# Patient Record
Sex: Male | Born: 1985 | Hispanic: Yes | Marital: Single | Smoking: Former smoker
Health system: Southern US, Community
[De-identification: ages and names within clinical notes are randomized; demographics above are authoritative.]

## PROBLEM LIST (undated history)

## (undated) DIAGNOSIS — G479 Sleep disorder, unspecified: Secondary | ICD-10-CM

## (undated) DIAGNOSIS — E669 Obesity, unspecified: Secondary | ICD-10-CM

## (undated) DIAGNOSIS — K219 Gastro-esophageal reflux disease without esophagitis: Secondary | ICD-10-CM

## (undated) DIAGNOSIS — T8859XA Other complications of anesthesia, initial encounter: Secondary | ICD-10-CM

## (undated) DIAGNOSIS — M5431 Sciatica, right side: Secondary | ICD-10-CM

## (undated) DIAGNOSIS — M549 Dorsalgia, unspecified: Secondary | ICD-10-CM

## (undated) DIAGNOSIS — J45909 Unspecified asthma, uncomplicated: Secondary | ICD-10-CM

## (undated) DIAGNOSIS — T4145XA Adverse effect of unspecified anesthetic, initial encounter: Secondary | ICD-10-CM

## (undated) DIAGNOSIS — L03314 Cellulitis of groin: Secondary | ICD-10-CM

## (undated) HISTORY — PX: TONSILLECTOMY: SUR1361

---

## 2006-06-09 ENCOUNTER — Emergency Department (HOSPITAL_COMMUNITY): Admission: EM | Admit: 2006-06-09 | Discharge: 2006-06-09 | Payer: Self-pay | Admitting: Emergency Medicine

## 2007-01-12 ENCOUNTER — Emergency Department (HOSPITAL_COMMUNITY): Admission: EM | Admit: 2007-01-12 | Discharge: 2007-01-12 | Payer: Self-pay | Admitting: Emergency Medicine

## 2008-04-13 ENCOUNTER — Emergency Department (HOSPITAL_COMMUNITY): Admission: EM | Admit: 2008-04-13 | Discharge: 2008-04-13 | Payer: Self-pay | Admitting: Emergency Medicine

## 2008-09-29 ENCOUNTER — Emergency Department (HOSPITAL_COMMUNITY): Admission: EM | Admit: 2008-09-29 | Discharge: 2008-09-29 | Payer: Self-pay | Admitting: Emergency Medicine

## 2008-11-22 ENCOUNTER — Emergency Department (HOSPITAL_COMMUNITY): Admission: EM | Admit: 2008-11-22 | Discharge: 2008-11-22 | Payer: Self-pay | Admitting: Emergency Medicine

## 2010-03-08 ENCOUNTER — Emergency Department (HOSPITAL_COMMUNITY): Admission: EM | Admit: 2010-03-08 | Discharge: 2010-03-08 | Payer: Self-pay | Admitting: Emergency Medicine

## 2011-05-10 ENCOUNTER — Emergency Department (HOSPITAL_COMMUNITY)
Admission: EM | Admit: 2011-05-10 | Discharge: 2011-05-10 | Disposition: A | Discharge status: Home / Self Care | Payer: Self-pay | Attending: Emergency Medicine | Admitting: Emergency Medicine

## 2011-05-10 ENCOUNTER — Encounter: Payer: Self-pay | Admitting: Emergency Medicine

## 2011-05-10 DIAGNOSIS — M5430 Sciatica, unspecified side: Secondary | ICD-10-CM | POA: Insufficient documentation

## 2011-05-10 DIAGNOSIS — M545 Low back pain, unspecified: Secondary | ICD-10-CM | POA: Insufficient documentation

## 2011-05-10 DIAGNOSIS — M5431 Sciatica, right side: Secondary | ICD-10-CM

## 2011-05-10 DIAGNOSIS — IMO0001 Reserved for inherently not codable concepts without codable children: Secondary | ICD-10-CM | POA: Insufficient documentation

## 2011-05-10 DIAGNOSIS — M79609 Pain in unspecified limb: Secondary | ICD-10-CM | POA: Insufficient documentation

## 2011-05-10 DIAGNOSIS — M25559 Pain in unspecified hip: Secondary | ICD-10-CM | POA: Insufficient documentation

## 2011-05-10 MED ORDER — DIAZEPAM 5 MG PO TABS
5.0000 mg | ORAL_TABLET | Freq: Once | ORAL | Status: AC
  Administered 2011-05-10: 5 mg via ORAL
  Filled 2011-05-10: qty 1

## 2011-05-10 MED ORDER — PREDNISONE 10 MG PO TABS: 50.0000 mg | ORAL_TABLET | Freq: Every day | ORAL | Status: AC

## 2011-05-10 MED ORDER — IBUPROFEN 800 MG PO TABS: 800.0000 mg | ORAL_TABLET | Freq: Three times a day (TID) | ORAL | Status: AC

## 2011-05-10 MED ORDER — DIAZEPAM 5 MG PO TABS: 5.0000 mg | ORAL_TABLET | Freq: Two times a day (BID) | ORAL | Status: AC

## 2011-05-10 NOTE — ED Provider Notes (Signed)
History     CSN: [id number] Arrival date & time: 05/10/2011  8:09 AM   First MD Initiated Contact with Patient 05/10/11 [phone]      Chief Complaint  Patient presents with  . Generalized Body Aches    (Consider location/radiation/quality/duration/timing/severity/associated sxs/prior treatment) HPI Comments: Patient states he has had 2-3 months of right lower extremity pain.  States pain begins in his right buttock and radiates down the back of his thigh into the top of his right calf.  Pain is exacerbated by bending over in certain positions.  Patient also has occasional low back pain.  Denies any known injury, fever, loss of control of bowel or bladder,abdominal pain, dysuria, or urinary frequency or urgency, change in bowel habits.  Has been using BenGay without relief.  Patient has no known medical problems, and does not have a primary care Dr.  The history is provided by the patient.    History reviewed. No pertinent past medical history.  History reviewed. No pertinent past surgical history.  No family history on file.  History  Substance Use Topics  . Smoking status: Never Smoker   . Smokeless tobacco: Not on file  . Alcohol Use: Yes      Review of Systems  All other systems reviewed and are negative.    Allergies  Review of patient's allergies indicates no known allergies.  Home Medications  No current outpatient prescriptions on file.  BP 130/70  Pulse 65  Temp(Src) 98.8 F (37.1 C) (Oral)  Resp 20  SpO2 96%  Physical Exam  Constitutional: He is oriented to person, place, and time. He appears well-developed and well-nourished.  HENT:  Head: Normocephalic and atraumatic.  Neck: Neck supple.  Cardiovascular: Normal rate, regular rhythm and normal heart sounds.   Pulmonary/Chest: Breath sounds normal. No respiratory distress. He has no wheezes. He has no rales. He exhibits no tenderness.  Abdominal: Soft. Bowel sounds are normal. There is no tenderness.    Musculoskeletal:       Spine nontender.  Straight leg raise on right is positive, left is negative.  Distal pulses intact and equal bilaterally.  No peripheral edema.  Sensation is intact.  Strength is five out of five in bilateral lower extremities. Skin is without erythema, edema or warmth.    Neurological: He is alert and oriented to person, place, and time.    ED Course  Procedures (including critical care time)  Labs Reviewed - No data to display No results found.   1. Sciatica of right side       MDM  Patient with no known injury, chronic (2-3 month) right buttock, hip, thigh pain. Has only attempted BenGay for pain.Spine is nontender.  Full strength, sensation intact.  No fever, loss of control of bowel or bladder.         Todd Velez, [place] 05/10/11 1455

## 2011-05-10 NOTE — ED Provider Notes (Signed)
Medical screening examination/treatment/procedure(s) were performed by non-physician practitioner and as supervising physician I was immediately available for consultation/collaboration.   Emilina Smarr M Emmarose Klinke, DO 05/10/11 1922 

## 2011-05-10 NOTE — ED Notes (Signed)
Rt sided leg pain x several months that radiates up to hip and lower back no injury he states

## 2011-09-21 ENCOUNTER — Other Ambulatory Visit (HOSPITAL_COMMUNITY): Payer: Self-pay | Admitting: Chiropractic Medicine

## 2011-09-21 DIAGNOSIS — M545 Low back pain: Secondary | ICD-10-CM

## 2011-09-24 ENCOUNTER — Inpatient Hospital Stay (HOSPITAL_COMMUNITY): Admission: RE | Admit: 2011-09-24 | Payer: Self-pay | Source: Ambulatory Visit

## 2011-10-07 ENCOUNTER — Other Ambulatory Visit (HOSPITAL_COMMUNITY): Payer: Self-pay | Admitting: Chiropractic Medicine

## 2011-10-07 DIAGNOSIS — M545 Low back pain: Secondary | ICD-10-CM

## 2011-10-10 ENCOUNTER — Ambulatory Visit (HOSPITAL_COMMUNITY)
Admission: RE | Admit: 2011-10-10 | Discharge: 2011-10-10 | Disposition: A | Discharge status: Home / Self Care | Payer: Self-pay | Source: Ambulatory Visit | Attending: Chiropractic Medicine | Admitting: Chiropractic Medicine

## 2011-10-10 DIAGNOSIS — M5146 Schmorl's nodes, lumbar region: Secondary | ICD-10-CM | POA: Insufficient documentation

## 2011-10-10 DIAGNOSIS — M5126 Other intervertebral disc displacement, lumbar region: Secondary | ICD-10-CM | POA: Insufficient documentation

## 2011-10-10 DIAGNOSIS — M79609 Pain in unspecified limb: Secondary | ICD-10-CM | POA: Insufficient documentation

## 2011-10-10 DIAGNOSIS — Q7649 Other congenital malformations of spine, not associated with scoliosis: Secondary | ICD-10-CM | POA: Insufficient documentation

## 2011-10-10 DIAGNOSIS — M545 Low back pain, unspecified: Secondary | ICD-10-CM | POA: Insufficient documentation

## 2011-11-16 ENCOUNTER — Other Ambulatory Visit: Payer: Self-pay | Admitting: Orthopedic Surgery

## 2011-11-18 ENCOUNTER — Encounter (HOSPITAL_COMMUNITY): Payer: Self-pay

## 2011-12-21 ENCOUNTER — Emergency Department (INDEPENDENT_AMBULATORY_CARE_PROVIDER_SITE_OTHER)
Admission: EM | Admit: 2011-12-21 | Discharge: 2011-12-21 | Disposition: A | Discharge status: Home / Self Care | Payer: Self-pay | Source: Home / Self Care | Attending: Emergency Medicine | Admitting: Emergency Medicine

## 2011-12-21 ENCOUNTER — Encounter (HOSPITAL_COMMUNITY): Payer: Self-pay | Admitting: *Deleted

## 2011-12-21 DIAGNOSIS — R221 Localized swelling, mass and lump, neck: Secondary | ICD-10-CM

## 2011-12-21 DIAGNOSIS — R220 Localized swelling, mass and lump, head: Secondary | ICD-10-CM

## 2011-12-21 HISTORY — DX: Dorsalgia, unspecified: M54.9

## 2011-12-21 MED ORDER — DOCOSANOL 10 % EX CREA: TOPICAL_CREAM | CUTANEOUS | Status: DC

## 2011-12-21 NOTE — ED Notes (Signed)
Pt  Reports  Some  Swelling of  His  Lips   For  About  5  Days    Small  Bumps      No  Angioedema  Appears  In no  Distress   Speaking in  Complete  sentances  No  New  meds  Or  Any  Known  Causative  Agents

## 2011-12-21 NOTE — ED Provider Notes (Signed)
History     CSN: [id number]  Arrival date & time 12/21/11  1411   First MD Initiated Contact with Patient 12/21/11 1507      Chief Complaint  Patient presents with  . Facial Swelling    (Consider location/radiation/quality/duration/timing/severity/associated sxs/prior treatment) HPI Comments: Patient presents this afternoon complaining of some swelling to his upper lip for about 5 days in which he noted small little bumps on his upper lip. Patient denies any facial swelling, difficulty swallowing, blister formations burning tingling or fevers.  The history is provided by the patient.    Past Medical History  Diagnosis Date  . Back pain     History reviewed. No pertinent past surgical history.  No family history on file.  History  Substance Use Topics  . Smoking status: Never Smoker   . Smokeless tobacco: Not on file  . Alcohol Use: Yes      Review of Systems  Constitutional: Negative for fever, activity change, appetite change and fatigue.  Skin: Negative for pallor, rash and wound.  Neurological: Negative for dizziness.    Allergies  Review of patient's allergies indicates no known allergies.  Home Medications   Current Outpatient Rx  Name Route Sig Dispense Refill  . DOCOSANOL 10 % EX CREA  Apply up to 5 times per day 2 g 0  . PREDNISONE (PAK) 10 MG PO TABS Oral Take 10 mg by mouth daily.      BP 131/67  Pulse 106  Temp 98.2 F (36.8 C) (Oral)  Resp 20  SpO2 98%  Physical Exam  Nursing note and vitals reviewed. Constitutional: Vital signs are normal. He appears well-developed and well-nourished.  Non-toxic appearance. He does not have a sickly appearance. He does not appear ill. No distress.  HENT:  Mouth/Throat: Oropharynx is clear and moist and mucous membranes are normal.    Abdominal: Soft.  Skin: Rash noted. There is erythema.    ED Course  Procedures (including critical care time)   Labs Reviewed  HERPES SIMPLEX VIRUS CULTURE   No  results found.   1. Swollen upper lip       MDM  And minimally visible upper lip eruption. Explained to patient that we couldn't rule out that this is not an eruption caused by herpes simplex type I, sample obtained for viral culture. Patient was prescribed with topical Abreva.        Jimmie Molly, MD 12/21/11 2034

## 2011-12-23 LAB — HERPES SIMPLEX VIRUS CULTURE: Culture: NOT DETECTED

## 2011-12-27 ENCOUNTER — Telehealth (HOSPITAL_COMMUNITY): Payer: Self-pay | Admitting: *Deleted

## 2011-12-27 NOTE — ED Notes (Signed)
Pt. called on VM and asked for his lab results. I called and left a message. Pt. called back.  Pt. verified x 2 and given results.  Herpes culture lip: No Herpes Simplex Virus detected.  Pt. looked on line and thinks they are [place] spots and thinks he needs Tetrinol cream to treat it. Denies pain or itching with them. States they are small and white. Discussed with Dr. Tressia Danas and she said to refer pt. to the dermatologist.  Pt. told to do this.  I said we do not have one on call. I told him [facility] is big but they have a 3 month wait for appointments. I told him he might do better to try other offices to see if he can get in sooner. Vassie Moselle 12/27/2011

## 2011-12-27 NOTE — ED Notes (Signed)
Pt. called back and asked if we tested him for GC/chlamydia. I said no just the Herpes. He asked if this was anything to worry about. I told him since Herpes test was neg. It probably was not serious but to f/u with dermatologist to find out what it is. Vassie Moselle 12/27/2011

## 2012-01-05 ENCOUNTER — Encounter (HOSPITAL_COMMUNITY): Payer: Self-pay

## 2012-01-05 ENCOUNTER — Ambulatory Visit (HOSPITAL_COMMUNITY)
Admission: RE | Admit: 2012-01-05 | Discharge: 2012-01-05 | Disposition: A | Discharge status: Home / Self Care | Payer: Self-pay | Source: Ambulatory Visit | Attending: Orthopedic Surgery | Admitting: Orthopedic Surgery

## 2012-01-05 ENCOUNTER — Encounter (HOSPITAL_COMMUNITY)
Admission: RE | Admit: 2012-01-05 | Discharge: 2012-01-05 | Disposition: A | Discharge status: Home / Self Care | Payer: Self-pay | Source: Ambulatory Visit | Attending: Orthopedic Surgery | Admitting: Orthopedic Surgery

## 2012-01-05 DIAGNOSIS — J45909 Unspecified asthma, uncomplicated: Secondary | ICD-10-CM | POA: Insufficient documentation

## 2012-01-05 DIAGNOSIS — Z01818 Encounter for other preprocedural examination: Secondary | ICD-10-CM | POA: Insufficient documentation

## 2012-01-05 DIAGNOSIS — K219 Gastro-esophageal reflux disease without esophagitis: Secondary | ICD-10-CM | POA: Insufficient documentation

## 2012-01-05 DIAGNOSIS — G479 Sleep disorder, unspecified: Secondary | ICD-10-CM | POA: Insufficient documentation

## 2012-01-05 DIAGNOSIS — F17200 Nicotine dependence, unspecified, uncomplicated: Secondary | ICD-10-CM | POA: Insufficient documentation

## 2012-01-05 HISTORY — DX: Gastro-esophageal reflux disease without esophagitis: K21.9

## 2012-01-05 HISTORY — DX: Obesity, unspecified: E66.9

## 2012-01-05 HISTORY — DX: Unspecified asthma, uncomplicated: J45.909

## 2012-01-05 HISTORY — DX: Sleep disorder, unspecified: G47.9

## 2012-01-05 LAB — CBC
HCT: 44.8 % (ref 39.0–52.0)
Hemoglobin: 15.5 g/dL (ref 13.0–17.0)
RBC: 5.31 MIL/uL (ref 4.22–5.81)
WBC: 9.2 10*3/uL (ref 4.0–10.5)

## 2012-01-05 LAB — TYPE AND SCREEN

## 2012-01-05 LAB — URINALYSIS, ROUTINE W REFLEX MICROSCOPIC
Bilirubin Urine: NEGATIVE
Glucose, UA: NEGATIVE mg/dL
Leukocytes, UA: NEGATIVE
Nitrite: NEGATIVE
Specific Gravity, Urine: 1.022 (ref 1.005–1.030)
pH: 7 (ref 5.0–8.0)

## 2012-01-05 LAB — COMPREHENSIVE METABOLIC PANEL
ALT: 34 U/L (ref 0–53)
Alkaline Phosphatase: 67 U/L (ref 39–117)
BUN: 11 mg/dL (ref 6–23)
CO2: 26 mEq/L (ref 19–32)
Chloride: 101 mEq/L (ref 96–112)
GFR calc Af Amer: >90 mL/min (ref >90)
Glucose, Bld: 90 mg/dL (ref 70–99)
Potassium: 4 mEq/L (ref 3.5–5.1)
Sodium: 137 mEq/L (ref 135–145)
Total Bilirubin: 0.2 mg/dL — ABNORMAL LOW (ref 0.3–1.2)
Total Protein: 7.6 g/dL (ref 6.0–8.3)

## 2012-01-05 LAB — DIFFERENTIAL
Basophils Relative: 0 % (ref 0–1)
Eosinophils Absolute: 0.2 10*3/uL (ref 0.0–0.7)
Eosinophils Relative: 2 % (ref 0–5)
Lymphs Abs: 3 10*3/uL (ref 0.7–4.0)
Neutrophils Relative %: 54 % (ref 43–77)

## 2012-01-05 LAB — SURGICAL PCR SCREEN: Staphylococcus aureus: NEGATIVE

## 2012-01-05 LAB — APTT: aPTT: 32 seconds (ref 24–37)

## 2012-01-05 LAB — PROTIME-INR
INR: 0.98 (ref 0.00–1.49)
Prothrombin Time: 13.2 seconds (ref 11.6–15.2)

## 2012-01-05 LAB — ABO/RH: ABO/RH(D): O NEG

## 2012-01-05 NOTE — Pre-Procedure Instructions (Signed)
20 Todd Velez  01/05/2012   Your procedure is scheduled on:  01/12/2012  Report to [facility] at 5:30 AM.  Call this number if you have problems the morning of surgery: [phone]   Remember:   Do not eat food or drink liquid :After Midnight.  Wednesday      Take these medicines the morning of surgery with A SIP OF WATER: NOTHING   Do not wear jewelry, make-up or nail polish.  Do not wear lotions, powders, or perfumes. You may wear deodorant.  Do not shave 48 hours prior to surgery. Men may shave face and neck.  Do not bring valuables to the hospital.  Contacts, dentures or bridgework may not be worn into surgery.  Leave suitcase in the car. After surgery it may be brought to your room.  For patients admitted to the hospital, checkout time is 11:00 AM the day of discharge.   Patients discharged the day of surgery will not be allowed to drive home.  Name and phone number of your driver: /w friend   Special Instructions: CHG Shower Use Special Wash: 1/2 bottle night before surgery and 1/2 bottle morning of surgery.   Please read over the following fact sheets that you were given: Pain Booklet, Coughing and Deep Breathing, Blood Transfusion Information, MRSA Information and Surgical Site Infection Prevention

## 2012-01-11 MED ORDER — DEXTROSE 5 % IV SOLN
3.0000 g | INTRAVENOUS | Status: AC
  Administered 2012-01-12: 3 g via INTRAVENOUS
  Filled 2012-01-11: qty 3000

## 2012-01-11 MED ORDER — POVIDONE-IODINE 7.5 % EX SOLN
Freq: Once | CUTANEOUS | Status: DC
  Filled 2012-01-11: qty 118

## 2012-01-12 ENCOUNTER — Ambulatory Visit (HOSPITAL_COMMUNITY): Payer: Self-pay | Admitting: Anesthesiology

## 2012-01-12 ENCOUNTER — Encounter (HOSPITAL_COMMUNITY)
Admission: RE | Disposition: A | Discharge status: Home / Self Care | Payer: Self-pay | Source: Ambulatory Visit | Attending: Orthopedic Surgery

## 2012-01-12 ENCOUNTER — Ambulatory Visit (HOSPITAL_COMMUNITY): Payer: Self-pay

## 2012-01-12 ENCOUNTER — Encounter (HOSPITAL_COMMUNITY): Payer: Self-pay | Admitting: *Deleted

## 2012-01-12 ENCOUNTER — Encounter (HOSPITAL_COMMUNITY): Payer: Self-pay | Admitting: Anesthesiology

## 2012-01-12 ENCOUNTER — Ambulatory Visit (HOSPITAL_COMMUNITY)
Admission: RE | Admit: 2012-01-12 | Discharge: 2012-01-12 | Disposition: A | Discharge status: Home / Self Care | Payer: MEDICAID | Source: Ambulatory Visit | Attending: Orthopedic Surgery | Admitting: Orthopedic Surgery

## 2012-01-12 DIAGNOSIS — M519 Unspecified thoracic, thoracolumbar and lumbosacral intervertebral disc disorder: Secondary | ICD-10-CM | POA: Insufficient documentation

## 2012-01-12 DIAGNOSIS — M5410 Radiculopathy, site unspecified: Secondary | ICD-10-CM

## 2012-01-12 DIAGNOSIS — M5380 Other specified dorsopathies, site unspecified: Secondary | ICD-10-CM | POA: Insufficient documentation

## 2012-01-12 DIAGNOSIS — G4730 Sleep apnea, unspecified: Secondary | ICD-10-CM | POA: Insufficient documentation

## 2012-01-12 DIAGNOSIS — K219 Gastro-esophageal reflux disease without esophagitis: Secondary | ICD-10-CM | POA: Insufficient documentation

## 2012-01-12 HISTORY — PX: LUMBAR LAMINECTOMY/DECOMPRESSION MICRODISCECTOMY: SHX5026

## 2012-01-12 SURGERY — LUMBAR LAMINECTOMY/DECOMPRESSION MICRODISCECTOMY
Anesthesia: General | Site: Spine Lumbar | Laterality: Right | Wound class: Clean

## 2012-01-12 MED ORDER — HYDROMORPHONE HCL PF 1 MG/ML IJ SOLN
INTRAMUSCULAR | Status: AC
  Filled 2012-01-12: qty 2

## 2012-01-12 MED ORDER — HYDROMORPHONE HCL PF 1 MG/ML IJ SOLN
0.2500 mg | INTRAMUSCULAR | Status: DC | PRN
  Administered 2012-01-12 (×5): 0.5 mg via INTRAVENOUS

## 2012-01-12 MED ORDER — ONDANSETRON HCL 4 MG/2ML IJ SOLN
INTRAMUSCULAR | Status: DC | PRN
  Administered 2012-01-12: 4 mg via INTRAVENOUS

## 2012-01-12 MED ORDER — THROMBIN 20000 UNITS EX KIT
PACK | CUTANEOUS | Status: DC | PRN
  Administered 2012-01-12: 13:00:00 via TOPICAL

## 2012-01-12 MED ORDER — BACITRACIN ZINC 500 UNIT/GM EX OINT
TOPICAL_OINTMENT | CUTANEOUS | Status: AC
  Filled 2012-01-12: qty 15

## 2012-01-12 MED ORDER — MORPHINE SULFATE 10 MG/ML IJ SOLN
INTRAMUSCULAR | Status: DC | PRN
  Administered 2012-01-12 (×2): 4 mg via INTRAVENOUS

## 2012-01-12 MED ORDER — METHYLPREDNISOLONE ACETATE 80 MG/ML IJ SUSP
INTRAMUSCULAR | Status: DC | PRN
  Administered 2012-01-12: .5 mL via INTRA_ARTICULAR

## 2012-01-12 MED ORDER — 0.9 % SODIUM CHLORIDE (POUR BTL) OPTIME
TOPICAL | Status: DC | PRN
  Administered 2012-01-12: 1000 mL

## 2012-01-12 MED ORDER — BACITRACIN ZINC 500 UNIT/GM EX OINT
TOPICAL_OINTMENT | CUTANEOUS | Status: DC | PRN
  Administered 2012-01-12: 1 via TOPICAL

## 2012-01-12 MED ORDER — PHENYLEPHRINE HCL 10 MG/ML IJ SOLN
INTRAMUSCULAR | Status: DC | PRN
  Administered 2012-01-12: 40 ug via INTRAVENOUS

## 2012-01-12 MED ORDER — INDIGOTINDISULFONATE SODIUM 8 MG/ML IJ SOLN
INTRAMUSCULAR | Status: AC
  Filled 2012-01-12: qty 5

## 2012-01-12 MED ORDER — GLYCOPYRROLATE 0.2 MG/ML IJ SOLN
INTRAMUSCULAR | Status: DC | PRN
  Administered 2012-01-12: 0.6 mg via INTRAVENOUS

## 2012-01-12 MED ORDER — LIDOCAINE HCL (CARDIAC) 20 MG/ML IV SOLN
INTRAVENOUS | Status: DC | PRN
  Administered 2012-01-12: 100 mg via INTRAVENOUS

## 2012-01-12 MED ORDER — LACTATED RINGERS IV SOLN
INTRAVENOUS | Status: DC
  Administered 2012-01-12 (×4): via INTRAVENOUS

## 2012-01-12 MED ORDER — ROCURONIUM BROMIDE 100 MG/10ML IV SOLN
INTRAVENOUS | Status: DC | PRN
  Administered 2012-01-12: 40 mg via INTRAVENOUS

## 2012-01-12 MED ORDER — PROPOFOL 10 MG/ML IV BOLUS
INTRAVENOUS | Status: DC | PRN
  Administered 2012-01-12: 300 mg via INTRAVENOUS

## 2012-01-12 MED ORDER — METHYLPREDNISOLONE ACETATE 80 MG/ML IJ SUSP
INTRAMUSCULAR | Status: AC
  Filled 2012-01-12: qty 1

## 2012-01-12 MED ORDER — BUPIVACAINE-EPINEPHRINE 0.25% -1:200000 IJ SOLN
INTRAMUSCULAR | Status: DC | PRN
  Administered 2012-01-12: 5 mL

## 2012-01-12 MED ORDER — BUPIVACAINE-EPINEPHRINE PF 0.25-1:200000 % IJ SOLN
INTRAMUSCULAR | Status: AC
  Filled 2012-01-12: qty 30

## 2012-01-12 MED ORDER — SUFENTANIL CITRATE 50 MCG/ML IV SOLN
INTRAVENOUS | Status: DC | PRN
  Administered 2012-01-12 (×5): 10 ug via INTRAVENOUS

## 2012-01-12 MED ORDER — MIDAZOLAM HCL 5 MG/5ML IJ SOLN
INTRAMUSCULAR | Status: DC | PRN
  Administered 2012-01-12: 2 mg via INTRAVENOUS

## 2012-01-12 MED ORDER — HEMOSTATIC AGENTS (NO CHARGE) OPTIME
TOPICAL | Status: DC | PRN
  Administered 2012-01-12: 1

## 2012-01-12 MED ORDER — NEOSTIGMINE METHYLSULFATE 1 MG/ML IJ SOLN
INTRAMUSCULAR | Status: DC | PRN
  Administered 2012-01-12: 5 mg via INTRAVENOUS

## 2012-01-12 MED ORDER — SUCCINYLCHOLINE CHLORIDE 20 MG/ML IJ SOLN
INTRAMUSCULAR | Status: DC | PRN
  Administered 2012-01-12: 120 mg via INTRAVENOUS

## 2012-01-12 SURGICAL SUPPLY — 62 items
APL SKNCLS STERI-STRIP NONHPOA (GAUZE/BANDAGES/DRESSINGS) ×1
BENZOIN TINCTURE PRP APPL 2/3 (GAUZE/BANDAGES/DRESSINGS) ×1 IMPLANT
BUR ROUND PRECISION 4.0 (BURR) ×2 IMPLANT
CANISTER SUCTION 2500CC (MISCELLANEOUS) ×2 IMPLANT
CLOSURE STERI-STRIP 1/4X4 (GAUZE/BANDAGES/DRESSINGS) ×1 IMPLANT
CLOTH BEACON ORANGE TIMEOUT ST (SAFETY) ×2 IMPLANT
CONT SPEC STER OR (MISCELLANEOUS) ×2 IMPLANT
CORDS BIPOLAR (ELECTRODE) ×2 IMPLANT
COVER SURGICAL LIGHT HANDLE (MISCELLANEOUS) ×2 IMPLANT
DRAIN CHANNEL 10F 3/8 F FF (DRAIN) IMPLANT
DRAPE POUCH INSTRU U-SHP 10X18 (DRAPES) ×4 IMPLANT
DRAPE SURG 17X23 STRL (DRAPES) ×8 IMPLANT
DURAPREP 26ML APPLICATOR (WOUND CARE) ×2 IMPLANT
ELECT BLADE 4.0 EZ CLEAN MEGAD (MISCELLANEOUS) ×2
ELECT BLADE 6.5 EXT (BLADE) IMPLANT
ELECT CAUTERY BLADE 6.4 (BLADE) ×1 IMPLANT
ELECT REM PT RETURN 9FT ADLT (ELECTROSURGICAL) ×2
ELECTRODE BLDE 4.0 EZ CLN MEGD (MISCELLANEOUS) IMPLANT
ELECTRODE REM PT RTRN 9FT ADLT (ELECTROSURGICAL) ×1 IMPLANT
EVACUATOR SILICONE 100CC (DRAIN) IMPLANT
FILTER STRAW FLUID ASPIR (MISCELLANEOUS) ×2 IMPLANT
GAUZE SPONGE 4X4 16PLY XRAY LF (GAUZE/BANDAGES/DRESSINGS) ×4 IMPLANT
GLOVE BIO SURGEON STRL SZ8 (GLOVE) ×2 IMPLANT
GLOVE BIOGEL PI IND STRL 8 (GLOVE) ×1 IMPLANT
GLOVE BIOGEL PI INDICATOR 8 (GLOVE) ×1
GOWN STRL NON-REIN LRG LVL3 (GOWN DISPOSABLE) ×4 IMPLANT
IV CATH 14GX2 1/4 (CATHETERS) ×2 IMPLANT
KIT BASIN OR (CUSTOM PROCEDURE TRAY) ×2 IMPLANT
KIT ROOM TURNOVER OR (KITS) ×2 IMPLANT
NDL 18GX1X1/2 (RX/OR ONLY) (NEEDLE) ×1 IMPLANT
NDL HYPO 25GX1X1/2 BEV (NEEDLE) ×1 IMPLANT
NDL SPNL 18GX3.5 QUINCKE PK (NEEDLE) ×2 IMPLANT
NEEDLE 18GX1X1/2 (RX/OR ONLY) (NEEDLE) ×2 IMPLANT
NEEDLE HYPO 25GX1X1/2 BEV (NEEDLE) ×2 IMPLANT
NEEDLE SPNL 18GX3.5 QUINCKE PK (NEEDLE) ×4 IMPLANT
NS IRRIG 1000ML POUR BTL (IV SOLUTION) ×2 IMPLANT
PACK LAMINECTOMY ORTHO (CUSTOM PROCEDURE TRAY) ×2 IMPLANT
PACK UNIVERSAL I (CUSTOM PROCEDURE TRAY) ×2 IMPLANT
PAD ARMBOARD 7.5X6 YLW CONV (MISCELLANEOUS) ×4 IMPLANT
PATTIES SURGICAL .5 X.5 (GAUZE/BANDAGES/DRESSINGS) ×1 IMPLANT
PATTIES SURGICAL .5 X1 (DISPOSABLE) ×2 IMPLANT
PATTIES SURGICAL 1X1 (DISPOSABLE) IMPLANT
SPONGE GAUZE 4X4 12PLY (GAUZE/BANDAGES/DRESSINGS) ×2 IMPLANT
STRIP CLOSURE SKIN 1/2X4 (GAUZE/BANDAGES/DRESSINGS) IMPLANT
SURGIFLO TRUKIT (HEMOSTASIS) IMPLANT
SUT ETHILON 3 0 FSL (SUTURE) IMPLANT
SUT VIC AB 0 CT1 27 (SUTURE)
SUT VIC AB 0 CT1 27XBRD ANBCTR (SUTURE) IMPLANT
SUT VIC AB 0 CT2 27 (SUTURE) ×2 IMPLANT
SUT VIC AB 1 CT1 18XCR BRD 8 (SUTURE) ×1 IMPLANT
SUT VIC AB 1 CT1 8-18 (SUTURE) ×2
SUT VIC AB 2-0 CT2 18 VCP726D (SUTURE) ×2 IMPLANT
SYR 20CC LL (SYRINGE) IMPLANT
SYR BULB IRRIGATION 50ML (SYRINGE) ×2 IMPLANT
SYR CONTROL 10ML LL (SYRINGE) ×2 IMPLANT
SYR TB 1ML 26GX3/8 SAFETY (SYRINGE) ×4 IMPLANT
SYR TB 1ML LUER SLIP (SYRINGE) ×4 IMPLANT
TAPE CLOTH SURG 6X10 WHT LF (GAUZE/BANDAGES/DRESSINGS) ×1 IMPLANT
TOWEL OR 17X24 6PK STRL BLUE (TOWEL DISPOSABLE) ×2 IMPLANT
TOWEL OR 17X26 10 PK STRL BLUE (TOWEL DISPOSABLE) ×2 IMPLANT
WATER STERILE IRR 1000ML POUR (IV SOLUTION) ×2 IMPLANT
YANKAUER SUCT BULB TIP NO VENT (SUCTIONS) ×2 IMPLANT

## 2012-01-12 NOTE — H&P (Signed)
PREOPERATIVE H&P  Chief Complaint: leg pain  HPI: Todd Velez is a 26 y.o. male who presents with leg pain  Past Medical History  Diagnosis Date  . Back pain   . Asthma     as a child had "episode" of  asthma, used inhaler "once"  . Sleep disorder     pt. reports having sleep study at 28 yrs., old, reports it was normal but he  reports he snores at night    . GERD (gastroesophageal reflux disease)     occas., last episode, 2 months ago  . Obesity    Past Surgical History  Procedure Date  . Tonsillectomy     as a 10 yr. old   History   Social History  . Marital Status: Single    Spouse Name: N/A    Number of Children: N/A  . Years of Education: N/A   Social History Main Topics  . Smoking status: Former Smoker    Quit date: 12/06/2011  . Smokeless tobacco: Not on file  . Alcohol Use: 7.2 oz/week    12 Cans of beer per week     quit early July- 12 beers in one week   . Drug Use: Yes    Special: Marijuana     quit early July- had been using 5-6 times per day    . Sexually Active:    Other Topics Concern  . Not on file   Social History Narrative  . No narrative on file   No family history on file. No Known Allergies Prior to Admission medications   Medication Sig Start Date End Date Taking? Authorizing Provider  lip balm (CARMEX) ointment Apply 1 application topically daily as needed. Apply to bumps on lips.    Historical Provider, MD     All other systems have been reviewed and were otherwise negative with the exception of those mentioned in the HPI and as above.  Physical Exam: There were no vitals filed for this visit.  General: Alert, no acute distress Cardiovascular: No pedal edema Respiratory: No cyanosis, no use of accessory musculature GI: No organomegaly, abdomen is soft and non-tender Skin: No lesions in the area of chief complaint Neurologic: Sensation intact distally Psychiatric: Patient is competent for consent with normal mood and  affect Lymphatic: No axillary or cervical lymphadenopathy  MUSCULOSKELETAL: + SLR  Assessment/Plan: leg pain Plan for Procedure(s): LUMBAR LAMINECTOMY/DECOMPRESSION MICRODISCECTOMY, L5/S1   Emilee Hero, MD 01/12/2012 6:52 AM

## 2012-01-12 NOTE — Op Note (Signed)
Todd Velez, Todd Velez NO.:  [id number]  MEDICAL RECORD NO.:  [id number]  LOCATION:  [facility]                         FACILITY:  [facility]  PHYSICIAN:  Estill Bamberg, MD      DATE OF BIRTH:  [birth year]  DATE OF PROCEDURE:  01/12/2012 DATE OF DISCHARGE:                                OPERATIVE REPORT   PREOPERATIVE DIAGNOSES: 1. right-sided S1 radiculopathy. 2. right-sided L5-S1 disk osteophyte complex causing compression of the     traversing S1 nerve.  POSTOPERATIVE DIAGNOSES: 1. right-sided S1 radiculopathy. 2. right-sided L5-S1 disk osteophyte complex causing compression of the     traversing S1 nerve.  PROCEDURE:  right-sided L5-S1 microdiskectomy.  SURGEON:  Estill Bamberg, MD  ASSISTANT:  None.  ANESTHESIA:  General endotracheal anesthesia.  COMPLICATIONS:  None.  DISPOSITION:  Stable.  ESTIMATED BLOOD LOSS:  Minimal.  INDICATIONS FOR PROCEDURE:  Briefly, Todd Velez is a pleasant 26 year old male who presented to me with severe and debilitating pain in his Right leg.  The distribution of his pain was in the distribution of the S1 nerve on the right.  I did review an MRI, which was notable for a profound disk osteophyte complex involving the right lateral recess extending to the midline.  The patient had been having ongoing pain for over 3 months and had no improvement with conservative care.  We did discuss a microdiskectomy versus alternative treatments and the patient did wish to go forward with surgery.  The patient fully understood the risks and limitations of the procedure as outlined in my preoperative note.  OPERATIVE DETAILS:  On January 12, 2012, the patient was brought to Surgery and general endotracheal anesthesia was administered.  The patient was placed prone on a well-padded flat Jackson bed with a spinal frame.  Of particular note, the patient is a very obese male and it did take an extra efforts to position the patient appropriately.   The back was then prepped and draped in the usual sterile fashion.  A time-out procedure was performed and antibiotics were given.  I then made an incision centered over the L5-S1 interspace.  Of note, again, the patient is a very obese individual and it was a quite a far distance between the skin and the fascia below.  I was, however, ultimately able to gain access to the fascia and a curvilinear incision was made to the right side of the fascia.  The paraspinal musculature on the right side was bluntly swept laterally and a Taylor retractor was placed.  I did have to use extra long instruments throughout the case, given the significant distance between my hand and the patient's spinal canal.  I was, however, able to identify the L5-S1 intervertebral space using a lateral radiograph.  I then removed the inferior aspect of L5 in the ligamentum flavum.  I did perform a partial facetectomy.  I then was readily able to identify the traversing S1 nerve.  Of note, the traversing S1 nerve was readily noted to be under excessive pressure.  I did gain medialization of the traversing S1 nerve.  A very large disk osteophyte complex was readily identified.  Of note, this did appear  to be a chronic disk herniation. It was very much adherent to the vertebral bodies of L5 and S1.  I did therefore take a meticulous care in significant amount of time in order to remove all the fragments and osteophyte complexes that were causing compression of the traversing S1 nerve.  This did involve a 15-blade knife in addition to a series of reverse-angled curettes in addition to a series of pituitary rongeurs.  Again, this is a very meticulous portion of the procedure and this was furthered by the patient's large body habitus.  However, at the termination of this portion of the procedure, I was easily able to mobilize the traversing S1 nerve medially and laterally.  I then explored the wound for any undue bleeding.   I did use SurgiFlo with patties to control some minor epidural bleeding.  I then closed the fascia using #1 Vicryl.  The deep subcutaneous layer was closed using 0 Vicryl and the subcutaneous layer was closed using 2-0 Vicryl.  The skin was closed using 2-0 nylon. Bacitracin and sterile dressing was applied, and the patient was awakened from general endotracheal anesthesia and transferred to recovery in stable condition.     Estill Bamberg, MD     MD/MEDQ  D:  01/12/2012  T:  01/12/2012  Job:  [id number]

## 2012-01-12 NOTE — Transfer of Care (Signed)
Immediate Anesthesia Transfer of Care Note  Patient: Todd Velez [facility]  Procedure(s) Performed: Procedure(s) (LRB): LUMBAR LAMINECTOMY/DECOMPRESSION MICRODISCECTOMY (Right)  Patient Location: PACU  Anesthesia Type: General  Level of Consciousness: awake and alert   Airway & Oxygen Therapy: Patient Spontanous Breathing and Patient connected to face mask oxygen  Post-op Assessment: Report given to PACU RN and Post -op Vital signs reviewed and stable  Post vital signs: Reviewed and stable  Complications: No apparent anesthesia complications

## 2012-01-12 NOTE — Anesthesia Postprocedure Evaluation (Signed)
  Anesthesia Post-op Note  Patient: Todd Velez  Procedure(s) Performed: Procedure(s) (LRB): LUMBAR LAMINECTOMY/DECOMPRESSION MICRODISCECTOMY (Right)  Patient Location: PACU  Anesthesia Type: General  Level of Consciousness: awake  Airway and Oxygen Therapy: Patient Spontanous Breathing  Post-op Pain: mild  Post-op Assessment: Post-op Vital signs reviewed  Post-op Vital Signs: Reviewed  Complications: No apparent anesthesia complications 

## 2012-01-12 NOTE — Anesthesia Postprocedure Evaluation (Signed)
  Anesthesia Post-op Note  Patient: Todd Velez  Procedure(s) Performed: Procedure(s) (LRB): LUMBAR LAMINECTOMY/DECOMPRESSION MICRODISCECTOMY (Right)  Patient Location: PACU  Anesthesia Type: General  Level of Consciousness: awake  Airway and Oxygen Therapy: Patient Spontanous Breathing  Post-op Pain: mild  Post-op Assessment: Post-op Vital signs reviewed  Post-op Vital Signs: Reviewed  Complications: No apparent anesthesia complications

## 2012-01-12 NOTE — Preoperative (Signed)
Beta Blockers   Reason not to administer Beta Blockers:Not Applicable 

## 2012-01-12 NOTE — Anesthesia Preprocedure Evaluation (Addendum)
Anesthesia Evaluation  Patient identified by MRN, date of birth, ID band Patient awake    Reviewed: Allergy & Precautions, H&P , NPO status , Patient's Chart, lab work & pertinent test results, reviewed documented beta blocker date and time   Airway Mallampati: II TM Distance: >3 FB Neck ROM: Full    Dental  (+) Teeth Intact and Dental Advisory Given   Pulmonary asthma ,  Asthma as a child. No meds needed per patient. Pt denies having sleep apnea. breath sounds clear to auscultation        Cardiovascular negative cardio ROS  Rhythm:Regular Rate:Normal     Neuro/Psych    GI/Hepatic Neg liver ROS, GERD-  Controlled,  Endo/Other  negative endocrine ROSMorbid obesity  Renal/GU negative Renal ROS     Musculoskeletal negative musculoskeletal ROS (+)   Abdominal   Peds  Hematology negative hematology ROS (+)   Anesthesia Other Findings   Reproductive/Obstetrics                         Anesthesia Physical Anesthesia Plan  ASA: III  Anesthesia Plan: General   Post-op Pain Management:    Induction: Intravenous  Airway Management Planned: Oral ETT  Additional Equipment:   Intra-op Plan:   Post-operative Plan: Possible Post-op intubation/ventilation  Informed Consent: I have reviewed the patients History and Physical, chart, labs and discussed the procedure including the risks, benefits and alternatives for the proposed anesthesia with the patient or authorized representative who has indicated his/her understanding and acceptance.   Dental advisory given  Plan Discussed with: CRNA, Anesthesiologist and Surgeon  Anesthesia Plan Comments:       Anesthesia Quick Evaluation

## 2012-01-12 NOTE — Progress Notes (Signed)
Patient evaluated in PACU.  Pt comfortable, tolerating PO well, vitals stable.  Patient wishes to go home. Explained to patient that if any chest pain, SOB, dizzyness, etc were to occur, he should call 911.  Otherwise, I will plan on seeing the patient back in my office in 2 weeks.

## 2012-01-13 ENCOUNTER — Encounter (HOSPITAL_COMMUNITY): Payer: Self-pay | Admitting: Orthopedic Surgery

## 2012-05-15 ENCOUNTER — Emergency Department (HOSPITAL_COMMUNITY): Payer: Self-pay

## 2012-05-15 ENCOUNTER — Emergency Department (HOSPITAL_COMMUNITY)
Admission: EM | Admit: 2012-05-15 | Discharge: 2012-05-15 | Disposition: A | Discharge status: Home / Self Care | Payer: Self-pay | Attending: Emergency Medicine | Admitting: Emergency Medicine

## 2012-05-15 ENCOUNTER — Encounter (HOSPITAL_COMMUNITY): Payer: Self-pay | Admitting: *Deleted

## 2012-05-15 DIAGNOSIS — Z87891 Personal history of nicotine dependence: Secondary | ICD-10-CM | POA: Insufficient documentation

## 2012-05-15 DIAGNOSIS — R079 Chest pain, unspecified: Secondary | ICD-10-CM | POA: Insufficient documentation

## 2012-05-15 DIAGNOSIS — E669 Obesity, unspecified: Secondary | ICD-10-CM | POA: Insufficient documentation

## 2012-05-15 DIAGNOSIS — J40 Bronchitis, not specified as acute or chronic: Secondary | ICD-10-CM

## 2012-05-15 DIAGNOSIS — J45909 Unspecified asthma, uncomplicated: Secondary | ICD-10-CM | POA: Insufficient documentation

## 2012-05-15 DIAGNOSIS — Z8719 Personal history of other diseases of the digestive system: Secondary | ICD-10-CM | POA: Insufficient documentation

## 2012-05-15 MED ORDER — BENZONATATE 100 MG PO CAPS: 100.0000 mg | ORAL_CAPSULE | Freq: Three times a day (TID) | ORAL | Status: DC

## 2012-05-15 NOTE — ED Provider Notes (Addendum)
History     CSN: [id number]  Arrival date & time 05/15/12  0115   First MD Initiated Contact with Patient 05/15/12 0142      Chief Complaint  Patient presents with  . Cough  . Chest Pain    (Consider location/radiation/quality/duration/timing/severity/associated sxs/prior treatment) Patient is a 26 y.o. male presenting with cough and chest pain. The history is provided by the patient.  Cough Associated symptoms include chest pain. Pertinent negatives include no chills and no shortness of breath.  Chest Pain Primary symptoms include cough. Pertinent negatives for primary symptoms include no fever, no shortness of breath, no palpitations, no nausea and no vomiting.  Pertinent negatives for associated symptoms include no diaphoresis.    26 year old, male, presents emergency department complaining of cough with green sputum, which caused chest pain.  He denies chest pain.  Now.  He has not had fevers, chills, nausea, vomiting, sweating.  He does not smoke cigarettes.  He denies asthma.  Presently, he is asymptomatic.  Past Medical History  Diagnosis Date  . Back pain   . Asthma     as a child had "episode" of  asthma, used inhaler "once"  . Sleep disorder     pt. reports having sleep study at 29 yrs., old, reports it was normal but he  reports he snores at night    . GERD (gastroesophageal reflux disease)     occas., last episode, 2 months ago  . Obesity     Past Surgical History  Procedure Date  . Tonsillectomy     as a 10 yr. old  . Lumbar laminectomy/decompression microdiscectomy 01/12/2012    Procedure: LUMBAR LAMINECTOMY/DECOMPRESSION MICRODISCECTOMY;  Surgeon: Emilee Hero, MD;  Location: [facility] OR;  Service: Orthopedics;  Laterality: Right;  Right sided lumbar 5-sacrum 1 microdisectomy    History reviewed. No pertinent family history.  History  Substance Use Topics  . Smoking status: Former Smoker    Quit date: 12/06/2011  . Smokeless tobacco: Not on file    . Alcohol Use: 7.2 oz/week    12 Cans of beer per week      Review of Systems  Constitutional: Negative for fever, chills and diaphoresis.  HENT: Negative for congestion.   Respiratory: Positive for cough. Negative for shortness of breath.   Cardiovascular: Positive for chest pain. Negative for palpitations and leg swelling.  Gastrointestinal: Negative for nausea and vomiting.  All other systems reviewed and are negative.    Allergies  Review of patient's allergies indicates no known allergies.  Home Medications   Current Outpatient Rx  Name  Route  Sig  Dispense  Refill  . VAPORIZING CHEST RUB EX   Apply externally   Apply 1 application topically every 6 (six) hours as needed. Cold symptoms/cough           BP 128/76  Pulse 91  Temp 98.5 F (36.9 C)  Resp 16  SpO2 97%  Physical Exam  Nursing note and vitals reviewed. Constitutional: He is oriented to person, place, and time. No distress.       Morbidly obese  HENT:  Head: Normocephalic and atraumatic.  Eyes: Conjunctivae normal are normal.  Neck: Normal range of motion. Neck supple.  Cardiovascular: Normal rate and regular rhythm.   No murmur heard. Pulmonary/Chest: Effort normal and breath sounds normal. He has no wheezes. He has no rales.  Abdominal: He exhibits no distension.  Musculoskeletal: Normal range of motion.  Neurological: He is alert and oriented to person, place,  and time. No cranial nerve deficit.  Skin: Skin is warm and dry.  Psychiatric: He has a normal mood and affect. Thought content normal.    ED Course  Procedures (including critical care time)  Labs Reviewed - No data to display Dg Chest 2 View  05/15/2012  *RADIOLOGY REPORT*  Clinical Data: Productive cough for 3 days; chest soreness.  CHEST - 2 VIEW  Comparison: Chest radiograph performed 01/05/2012  Findings: The lungs are well-aerated and clear.  There is no evidence of focal opacification, pleural effusion or pneumothorax.   The heart is borderline normal in size; the mediastinal contour is within normal limits.  No acute osseous abnormalities are seen.  IMPRESSION: No acute cardiopulmonary process seen.   Original Report Authenticated By: Tonia Ghent, M.D.      No diagnosis found.   Date: 05/15/2012  Rate: 93  Rhythm: normal sinus rhythm  QRS Axis: normal  Intervals: normal  ST/T Wave abnormalities: normal  Conduction Disutrbances: none  Narrative Interpretation: unremarkable      MDM  Bronchitis, which caused chest pain.  No chest pain.  Now.  No pneumonia.  No respiratory distress.  No signs of significant illness       Cheri Guppy, MD 05/15/12 4742  Cheri Guppy, MD 05/15/12 [phone]

## 2012-05-15 NOTE — ED Notes (Signed)
States his girlfriend had bronchitis last week and thinks he got it from her.

## 2012-05-15 NOTE — ED Notes (Signed)
Pt c/o productive cough x 3 days with yellow sputum.  C/o chest soreness from cough, also c/o throat pain.  Denies n/v/d, fevers.

## 2012-05-24 ENCOUNTER — Other Ambulatory Visit: Payer: Self-pay

## 2012-05-25 ENCOUNTER — Encounter (HOSPITAL_COMMUNITY): Payer: Self-pay

## 2012-05-25 ENCOUNTER — Emergency Department (HOSPITAL_COMMUNITY): Payer: Self-pay

## 2012-05-25 ENCOUNTER — Emergency Department (HOSPITAL_COMMUNITY)
Admission: EM | Admit: 2012-05-25 | Discharge: 2012-05-25 | Disposition: A | Discharge status: Home / Self Care | Payer: Self-pay | Attending: Emergency Medicine | Admitting: Emergency Medicine

## 2012-05-25 DIAGNOSIS — J45909 Unspecified asthma, uncomplicated: Secondary | ICD-10-CM | POA: Insufficient documentation

## 2012-05-25 DIAGNOSIS — R05 Cough: Secondary | ICD-10-CM | POA: Insufficient documentation

## 2012-05-25 DIAGNOSIS — J40 Bronchitis, not specified as acute or chronic: Secondary | ICD-10-CM | POA: Insufficient documentation

## 2012-05-25 DIAGNOSIS — Z8719 Personal history of other diseases of the digestive system: Secondary | ICD-10-CM | POA: Insufficient documentation

## 2012-05-25 DIAGNOSIS — Z87891 Personal history of nicotine dependence: Secondary | ICD-10-CM | POA: Insufficient documentation

## 2012-05-25 DIAGNOSIS — G479 Sleep disorder, unspecified: Secondary | ICD-10-CM | POA: Insufficient documentation

## 2012-05-25 DIAGNOSIS — R059 Cough, unspecified: Secondary | ICD-10-CM | POA: Insufficient documentation

## 2012-05-25 MED ORDER — AZITHROMYCIN 250 MG PO TABS
500.0000 mg | ORAL_TABLET | Freq: Once | ORAL | Status: AC
  Administered 2012-05-25: 500 mg via ORAL
  Filled 2012-05-25: qty 2

## 2012-05-25 MED ORDER — AZITHROMYCIN 250 MG PO TABS: 250.0000 mg | ORAL_TABLET | Freq: Every day | ORAL | Status: DC

## 2012-05-25 MED ORDER — ALBUTEROL SULFATE HFA 108 (90 BASE) MCG/ACT IN AERS
2.0000 | INHALATION_SPRAY | RESPIRATORY_TRACT | Status: DC | PRN
  Administered 2012-05-25: 2 via RESPIRATORY_TRACT
  Filled 2012-05-25: qty 6.7

## 2012-05-25 NOTE — ED Provider Notes (Signed)
History     CSN: [id number]  Arrival date & time 05/25/12  0721   First MD Initiated Contact with Patient 05/25/12 [phone]      Chief Complaint  Patient presents with  . Chest Pain    (Consider location/radiation/quality/duration/timing/severity/associated sxs/prior treatment) HPI Pt with history of tobacco abuse quit several months ago, reports he has had productive cough, pleuritic chest pain and subjective fever for the last 2 weeks. Seen in the ED for same about 10 days ago with negative workup then. Given tessalon for cough. He reports his girlfriend was also recently diagnosed with bronchitis. Chest pain is mild, aching and worse with cough.   Past Medical History  Diagnosis Date  . Back pain   . Asthma     as a child had "episode" of  asthma, used inhaler "once"  . Sleep disorder     pt. reports having sleep study at 70 yrs., old, reports it was normal but he  reports he snores at night    . GERD (gastroesophageal reflux disease)     occas., last episode, 2 months ago  . Obesity     Past Surgical History  Procedure Date  . Tonsillectomy     as a 10 yr. old  . Lumbar laminectomy/decompression microdiscectomy 01/12/2012    Procedure: LUMBAR LAMINECTOMY/DECOMPRESSION MICRODISCECTOMY;  Surgeon: Emilee Hero, MD;  Location: [facility] OR;  Service: Orthopedics;  Laterality: Right;  Right sided lumbar 5-sacrum 1 microdisectomy    History reviewed. No pertinent family history.  History  Substance Use Topics  . Smoking status: Former Smoker    Quit date: 12/06/2011  . Smokeless tobacco: Not on file  . Alcohol Use: 7.2 oz/week    12 Cans of beer per week      Review of Systems All other systems reviewed and are negative except as noted in HPI.   Allergies  Review of patient's allergies indicates no known allergies.  Home Medications   Current Outpatient Rx  Name  Route  Sig  Dispense  Refill  . BENZONATATE 100 MG PO CAPS   Oral   Take 1 capsule (100 mg total)  by mouth every 8 (eight) hours.   21 capsule   0   . VAPORIZING CHEST RUB EX   Apply externally   Apply 1 application topically every 6 (six) hours as needed. Cold symptoms/cough           BP 135/75  Pulse 77  Temp 98.2 F (36.8 C) (Oral)  Resp 22  SpO2 96%  Physical Exam  Nursing note and vitals reviewed. Constitutional: He is oriented to person, place, and time. He appears well-developed.       Morbidly obese  HENT:  Head: Normocephalic and atraumatic.  Eyes: EOM are normal. Pupils are equal, round, and reactive to light.  Neck: Normal range of motion. Neck supple.  Cardiovascular: Normal rate, normal heart sounds and intact distal pulses.   Pulmonary/Chest: Effort normal and breath sounds normal. No respiratory distress. He has no wheezes. He has no rales.       Decreased air movement without wheeze  Abdominal: Bowel sounds are normal. He exhibits no distension. There is no tenderness.  Musculoskeletal: Normal range of motion. He exhibits no edema and no tenderness.  Neurological: He is alert and oriented to person, place, and time. He has normal strength. No cranial nerve deficit or sensory deficit.  Skin: Skin is warm and dry. No rash noted.  Psychiatric: He has a normal  mood and affect.    ED Course  Procedures (including critical care time)  Labs Reviewed - No data to display Dg Chest 2 View  05/25/2012  *RADIOLOGY REPORT*  Clinical Data: Pt c/o sob, productive cough and chest soreness front and back from coughing. ? Bronchitis per pt since similar symptoms to the last time he had it. Non-smoker.  CHEST - 2 VIEW  Comparison: 05/15/2012  Findings: Mild cardiomegaly noted.  No edema or pleural effusion. The lungs appear clear.  IMPRESSION:  1.  Mild cardiomegaly.   Otherwise, no significant abnormality identified.   Original Report Authenticated By: Gaylyn Rong, M.D.      No diagnosis found.    MDM  Likely mild bronchitis with continued cough symptoms as  expected. Will recheck CXR for developing PNA. Given albuterol HFA.    8:54 AM CXR neg, pt feeling better after inhaler. Z-pak for prolonged cough.      Irina Okelly B. Bernette Mayers, MD 05/25/12 [phone]

## 2012-05-25 NOTE — ED Notes (Signed)
Patient transported to X-ray 

## 2012-05-25 NOTE — ED Notes (Signed)
Pt sts he has a productive cough and chest/back pain

## 2012-05-30 ENCOUNTER — Emergency Department (HOSPITAL_COMMUNITY)
Admission: EM | Admit: 2012-05-30 | Discharge: 2012-05-30 | Disposition: A | Discharge status: Home / Self Care | Payer: Self-pay | Attending: Emergency Medicine | Admitting: Emergency Medicine

## 2012-05-30 ENCOUNTER — Encounter (HOSPITAL_COMMUNITY): Payer: Self-pay | Admitting: *Deleted

## 2012-05-30 DIAGNOSIS — Z79899 Other long term (current) drug therapy: Secondary | ICD-10-CM | POA: Insufficient documentation

## 2012-05-30 DIAGNOSIS — H9209 Otalgia, unspecified ear: Secondary | ICD-10-CM | POA: Insufficient documentation

## 2012-05-30 DIAGNOSIS — Z7901 Long term (current) use of anticoagulants: Secondary | ICD-10-CM | POA: Insufficient documentation

## 2012-05-30 DIAGNOSIS — J40 Bronchitis, not specified as acute or chronic: Secondary | ICD-10-CM | POA: Insufficient documentation

## 2012-05-30 DIAGNOSIS — J9801 Acute bronchospasm: Secondary | ICD-10-CM | POA: Insufficient documentation

## 2012-05-30 DIAGNOSIS — J45909 Unspecified asthma, uncomplicated: Secondary | ICD-10-CM | POA: Insufficient documentation

## 2012-05-30 DIAGNOSIS — K219 Gastro-esophageal reflux disease without esophagitis: Secondary | ICD-10-CM | POA: Insufficient documentation

## 2012-05-30 DIAGNOSIS — Z87891 Personal history of nicotine dependence: Secondary | ICD-10-CM | POA: Insufficient documentation

## 2012-05-30 DIAGNOSIS — E669 Obesity, unspecified: Secondary | ICD-10-CM | POA: Insufficient documentation

## 2012-05-30 DIAGNOSIS — G4730 Sleep apnea, unspecified: Secondary | ICD-10-CM | POA: Insufficient documentation

## 2012-05-30 MED ORDER — HYDROCOD POLST-CHLORPHEN POLST 10-8 MG/5ML PO LQCR: 5.0000 mL | Freq: Two times a day (BID) | ORAL | Status: DC | PRN

## 2012-05-30 MED ORDER — PREDNISONE 10 MG PO TABS: 20.0000 mg | ORAL_TABLET | Freq: Every day | ORAL | Status: DC

## 2012-05-30 NOTE — ED Provider Notes (Signed)
History     CSN: [id number]  Arrival date & time 05/30/12  2100   First MD Initiated Contact with Patient 05/30/12 2117      Chief Complaint  Patient presents with  . URI    (Consider location/radiation/quality/duration/timing/severity/associated sxs/prior treatment) Patient is a 26 y.o. male presenting with URI. The history is provided by the patient (pt states he is still coughing and his ears are clogged). No language interpreter was used.  URI The primary symptoms include ear pain and cough. Primary symptoms do not include fatigue, headaches, abdominal pain or rash. The current episode started 3 to 5 days ago. This is a recurrent problem. The problem has not changed since onset. Associated with: nothing. Symptoms associated with the illness include plugged ear sensation. The illness is not associated with chills, sinus pressure or congestion. Risk factors: nothing.    Past Medical History  Diagnosis Date  . Back pain   . Asthma     as a child had "episode" of  asthma, used inhaler "once"  . Sleep disorder     pt. reports having sleep study at 17 yrs., old, reports it was normal but he  reports he snores at night    . GERD (gastroesophageal reflux disease)     occas., last episode, 2 months ago  . Obesity     Past Surgical History  Procedure Date  . Tonsillectomy     as a 10 yr. old  . Lumbar laminectomy/decompression microdiscectomy 01/12/2012    Procedure: LUMBAR LAMINECTOMY/DECOMPRESSION MICRODISCECTOMY;  Surgeon: Emilee Hero, MD;  Location: [facility] OR;  Service: Orthopedics;  Laterality: Right;  Right sided lumbar 5-sacrum 1 microdisectomy    History reviewed. No pertinent family history.  History  Substance Use Topics  . Smoking status: Former Smoker    Quit date: 12/06/2011  . Smokeless tobacco: Not on file  . Alcohol Use: 7.2 oz/week    12 Cans of beer per week      Review of Systems  Constitutional: Negative for chills and fatigue.  HENT:  Positive for ear pain. Negative for congestion, sinus pressure and ear discharge.   Eyes: Negative for discharge.  Respiratory: Positive for cough.   Cardiovascular: Negative for chest pain.  Gastrointestinal: Negative for abdominal pain and diarrhea.  Genitourinary: Negative for frequency and hematuria.  Musculoskeletal: Negative for back pain.  Skin: Negative for rash.  Neurological: Negative for seizures and headaches.  Hematological: Negative.   Psychiatric/Behavioral: Negative for hallucinations.    Allergies  Review of patient's allergies indicates no known allergies.  Home Medications   Current Outpatient Rx  Name  Route  Sig  Dispense  Refill  . VAPORIZING CHEST RUB EX   Apply externally   Apply 1 application topically every 6 (six) hours as needed. Cold symptoms/cough         . DM-GUAIFENESIN ER 30-600 MG PO TB12   Oral   Take 1 tablet by mouth every 12 (twelve) hours. For congestion/cough         . ALBUTEROL SULFATE HFA 108 (90 BASE) MCG/ACT IN AERS   Inhalation   Inhale 2 puffs into the lungs every 2 (two) hours as needed. For shortness of breath         . AZITHROMYCIN 250 MG PO TABS   Oral   Take 1 tablet (250 mg total) by mouth daily.   4 tablet   0   . BENZONATATE 100 MG PO CAPS   Oral   Take 1  capsule (100 mg total) by mouth every 8 (eight) hours.   21 capsule   0   . HYDROCOD POLST-CPM POLST ER 10-8 MG/5ML PO LQCR   Oral   Take 5 mLs by mouth every 12 (twelve) hours as needed.   60 mL   0   . PREDNISONE 10 MG PO TABS   Oral   Take 2 tablets (20 mg total) by mouth daily.   15 tablet   0     There were no vitals taken for this visit.  Physical Exam  Constitutional: He is oriented to person, place, and time. He appears well-developed.  HENT:  Head: Normocephalic and atraumatic.       Left tm dull and retracted  Eyes: Conjunctivae normal and EOM are normal. No scleral icterus.  Neck: Neck supple. No thyromegaly present.   Cardiovascular: Normal rate and regular rhythm.  Exam reveals no gallop and no friction rub.   No murmur heard. Pulmonary/Chest: No stridor. He has no wheezes. He has no rales. He exhibits no tenderness.  Abdominal: He exhibits no distension. There is no tenderness. There is no rebound.  Musculoskeletal: Normal range of motion. He exhibits no edema.  Lymphadenopathy:    He has no cervical adenopathy.  Neurological: He is oriented to person, place, and time. Coordination normal.  Skin: No rash noted. No erythema.  Psychiatric: He has a normal mood and affect. His behavior is normal.    ED Course  Procedures (including critical care time)  Labs Reviewed - No data to display No results found.   1. Bronchitis   2. Bronchospasm       MDM          Benny Lennert, MD 05/30/12 2133

## 2012-05-30 NOTE — ED Notes (Addendum)
Pt seen on 12/5 at [facility] ED, diagnosed with bronchitis and given z-pack.  Reports no improvement in cough, congestion or SOB.  No distress noted during triage.  Pt reports cough is productive, with clear secretions.  States that previously they were thick and yellow.

## 2012-05-30 NOTE — ED Notes (Signed)
Discharge instructions given and reviewed with patient.  Prescriptions given for Tussionex and Prednisone; effects and use explained.  Patient verbalized understanding of possible sedating effect of Tussionex and to take Prednisone as directed.  Patient ambulatory with steady gait; discharged home in good condition.

## 2012-06-08 ENCOUNTER — Emergency Department (HOSPITAL_COMMUNITY): Payer: Self-pay

## 2012-06-08 ENCOUNTER — Emergency Department (HOSPITAL_COMMUNITY)
Admission: EM | Admit: 2012-06-08 | Discharge: 2012-06-08 | Disposition: A | Discharge status: Home / Self Care | Payer: Self-pay | Attending: Emergency Medicine | Admitting: Emergency Medicine

## 2012-06-08 ENCOUNTER — Encounter (HOSPITAL_COMMUNITY): Payer: Self-pay

## 2012-06-08 DIAGNOSIS — R1110 Vomiting, unspecified: Secondary | ICD-10-CM | POA: Insufficient documentation

## 2012-06-08 DIAGNOSIS — Z79899 Other long term (current) drug therapy: Secondary | ICD-10-CM | POA: Insufficient documentation

## 2012-06-08 DIAGNOSIS — J029 Acute pharyngitis, unspecified: Secondary | ICD-10-CM | POA: Insufficient documentation

## 2012-06-08 DIAGNOSIS — B9789 Other viral agents as the cause of diseases classified elsewhere: Secondary | ICD-10-CM | POA: Insufficient documentation

## 2012-06-08 DIAGNOSIS — J45909 Unspecified asthma, uncomplicated: Secondary | ICD-10-CM | POA: Insufficient documentation

## 2012-06-08 DIAGNOSIS — R0989 Other specified symptoms and signs involving the circulatory and respiratory systems: Secondary | ICD-10-CM | POA: Insufficient documentation

## 2012-06-08 DIAGNOSIS — R498 Other voice and resonance disorders: Secondary | ICD-10-CM | POA: Insufficient documentation

## 2012-06-08 DIAGNOSIS — Z87891 Personal history of nicotine dependence: Secondary | ICD-10-CM | POA: Insufficient documentation

## 2012-06-08 DIAGNOSIS — E669 Obesity, unspecified: Secondary | ICD-10-CM | POA: Insufficient documentation

## 2012-06-08 DIAGNOSIS — Z8719 Personal history of other diseases of the digestive system: Secondary | ICD-10-CM | POA: Insufficient documentation

## 2012-06-08 DIAGNOSIS — R059 Cough, unspecified: Secondary | ICD-10-CM | POA: Insufficient documentation

## 2012-06-08 DIAGNOSIS — Z8739 Personal history of other diseases of the musculoskeletal system and connective tissue: Secondary | ICD-10-CM | POA: Insufficient documentation

## 2012-06-08 DIAGNOSIS — R05 Cough: Secondary | ICD-10-CM | POA: Insufficient documentation

## 2012-06-08 DIAGNOSIS — R0602 Shortness of breath: Secondary | ICD-10-CM | POA: Insufficient documentation

## 2012-06-08 DIAGNOSIS — J3489 Other specified disorders of nose and nasal sinuses: Secondary | ICD-10-CM | POA: Insufficient documentation

## 2012-06-08 DIAGNOSIS — R0609 Other forms of dyspnea: Secondary | ICD-10-CM | POA: Insufficient documentation

## 2012-06-08 DIAGNOSIS — B349 Viral infection, unspecified: Secondary | ICD-10-CM

## 2012-06-08 LAB — PRO B NATRIURETIC PEPTIDE: Pro B Natriuretic peptide (BNP): 25.4 pg/mL (ref 0.0–100.0)

## 2012-06-08 LAB — BASIC METABOLIC PANEL
CO2: 26 mEq/L (ref 19–32)
Calcium: 9.2 mg/dL (ref 8.4–10.5)
Creatinine, Ser: 0.63 mg/dL (ref 0.50–1.35)
GFR calc Af Amer: >90 mL/min (ref >90)
GFR calc non Af Amer: >90 mL/min (ref >90)
Sodium: 139 mEq/L (ref 135–145)

## 2012-06-08 LAB — CBC WITH DIFFERENTIAL/PLATELET
Basophils Absolute: 0 10*3/uL (ref 0.0–0.1)
Basophils Relative: 0 % (ref 0–1)
Eosinophils Relative: 2 % (ref 0–5)
Lymphocytes Relative: 31 % (ref 12–46)
MCHC: 33 g/dL (ref 30.0–36.0)
MCV: 84.1 fL (ref 78.0–100.0)
Platelets: 231 10*3/uL (ref 150–400)
RDW: 13.9 % (ref 11.5–15.5)
WBC: 11.2 10*3/uL — ABNORMAL HIGH (ref 4.0–10.5)

## 2012-06-08 MED ORDER — GUAIFENESIN 100 MG/5ML PO LIQD: 100.0000 mg | ORAL | Status: DC | PRN

## 2012-06-08 NOTE — ED Notes (Signed)
Pt presents with 6 week h/o productive cough.  Pt was seen at hospital x 2 weeks ago, given cough medication, z-pack and prednisone, reports losing his voice with cough worsening.  Pt denies any difficulty swallowing, but reports discomfort to throat.  -shortness of breath or fever.

## 2012-06-08 NOTE — ED Notes (Signed)
Patient transported to X-ray 

## 2012-06-08 NOTE — ED Provider Notes (Signed)
History   This chart was scribed for Derwood Kaplan, MD by Charolett Bumpers, ED Scribe. The patient was seen in room TR09C/TR09C. Patient's care was started at 1315.   CSN: [id number]  Arrival date & time 06/08/12  1205   First MD Initiated Contact with Patient 06/08/12 1315      Chief Complaint  Patient presents with  . Cough    The history is provided by the patient. No language interpreter was used.   Todd Velez is a 26 y.o. male who presents to the Emergency Department complaining of persistent, moderate productive cough for the past 6 weeks. He states his cough produces yellow phlegm. He reports associated voice changes over the past 2 weeks. He also reports associated vomiting, sore throat and congestion. He states the congestion resolved after Prednisone. He reports SOB after coughing episodes and chest pain in the mid anterior and posterior regions between his shoulder blades. He was seen in [place] previously and given an albuterol breathing treatment, antibiotics, prednisone and cough syrup for 2 weeks. He states that he stopped taking the cough syrup a week ago, but is still taking the Prednisone. He denies any fevers, chills. He denies any sick contacts with similar symptoms.  He denies any night-sweats or recent weight loss. He denies recent incarnations. He states he uses 2-3 pillows at night. He denies any h/o asthma, DVT, PE, cardiac problems, prolonged or foreign travel, hormone use, CA, recent surgeries. He is exposed to smoke but denies any tobacco use. He denies any drug use.   Past Medical History  Diagnosis Date  . Back pain   . Asthma     as a child had "episode" of  asthma, used inhaler "once"  . Sleep disorder     pt. reports having sleep study at 33 yrs., old, reports it was normal but he  reports he snores at night    . GERD (gastroesophageal reflux disease)     occas., last episode, 2 months ago  . Obesity     Past Surgical History  Procedure  Date  . Tonsillectomy     as a 10 yr. old  . Lumbar laminectomy/decompression microdiscectomy 01/12/2012    Procedure: LUMBAR LAMINECTOMY/DECOMPRESSION MICRODISCECTOMY;  Surgeon: Emilee Hero, MD;  Location: [facility] OR;  Service: Orthopedics;  Laterality: Right;  Right sided lumbar 5-sacrum 1 microdisectomy    History reviewed. No pertinent family history.  History  Substance Use Topics  . Smoking status: Former Smoker    Quit date: 12/06/2011  . Smokeless tobacco: Not on file  . Alcohol Use: 7.2 oz/week    12 Cans of beer per week      Review of Systems  Constitutional: Negative for fever, chills and unexpected weight change.  HENT: Positive for congestion, sore throat and voice change.   Respiratory: Positive for cough and shortness of breath.   Cardiovascular: Positive for chest pain.  Gastrointestinal: Positive for vomiting.  All other systems reviewed and are negative.    Allergies  Review of patient's allergies indicates no known allergies.  Home Medications   Current Outpatient Rx  Name  Route  Sig  Dispense  Refill  . ALBUTEROL SULFATE HFA 108 (90 BASE) MCG/ACT IN AERS   Inhalation   Inhale 2 puffs into the lungs every 2 (two) hours as needed. For shortness of breath           BP 160/47  Pulse 75  Temp 98.5 F (36.9 C) (Oral)  Resp 18  SpO2 96%  Physical Exam  Nursing note and vitals reviewed. Constitutional: He is oriented to person, place, and time. He appears well-developed and well-nourished. No distress.  HENT:  Head: Normocephalic and atraumatic.  Eyes: EOM are normal.  Neck: Neck supple. No tracheal deviation present.  Cardiovascular: Normal rate, regular rhythm and normal heart sounds.   No murmur heard. Pulmonary/Chest: Effort normal and breath sounds normal. No respiratory distress.       Lungs are clear to ausculation. No crackles at base.   Musculoskeletal: Normal range of motion.  Neurological: He is alert and oriented to person,  place, and time.  Skin: Skin is warm and dry.  Psychiatric: He has a normal mood and affect. His behavior is normal.    ED Course  Procedures (including critical care time)  DIAGNOSTIC STUDIES: Oxygen Saturation is 96% on room air, adequate by my interpretation.    COORDINATION OF CARE:  13:36-Discussed planned course of treatment with the patient including a chest x-ray, blood work and EKG, who is agreeable at this time.    Labs Reviewed  CBC WITH DIFFERENTIAL - Abnormal; Notable for the following:    WBC 11.2 (*)     Monocytes Absolute 1.1 (*)     All other components within normal limits  BASIC METABOLIC PANEL  PRO B NATRIURETIC PEPTIDE   Dg Chest 2 View  06/08/2012  *RADIOLOGY REPORT*  Clinical Data: Productive cough.  Upper back pain.  Chest tightness.  Vomiting.  Childhood asthma.  CHEST - 2 VIEW  Comparison: 05/25/2012 and 04/13/2089.  Findings: No infiltrate, congestive heart failure or pneumothorax.  Heart size within normal limits.  IMPRESSION: No acute abnormality.   Original Report Authenticated By: Lacy Duverney, M.D.      No diagnosis found.    MDM  I personally performed the services described in this documentation, which was scribed in my presence. The recorded information has been reviewed and is accurate.  Pt comes in with cc of cough.  He has had a cough for a month now, On steroids, and he has finished a course of zpack.  He is not a smoker, he is having no fevers. No night sweats, weight loss, no hx of cancers or risk factors for the same.  I am not sure what the cause of his cough is. I will get BNP to r/o CHF - exam shows no evidence of CHF, but normal BNP can essentially r/o CHF.       Derwood Kaplan, MD 06/08/12 1630

## 2012-08-09 ENCOUNTER — Emergency Department (HOSPITAL_COMMUNITY)
Admission: EM | Admit: 2012-08-09 | Discharge: 2012-08-09 | Disposition: A | Discharge status: Home / Self Care | Payer: Self-pay | Attending: Emergency Medicine | Admitting: Emergency Medicine

## 2012-08-09 ENCOUNTER — Emergency Department (HOSPITAL_COMMUNITY): Payer: Self-pay

## 2012-08-09 ENCOUNTER — Encounter (HOSPITAL_COMMUNITY): Payer: Self-pay | Admitting: *Deleted

## 2012-08-09 DIAGNOSIS — Y9329 Activity, other involving ice and snow: Secondary | ICD-10-CM | POA: Insufficient documentation

## 2012-08-09 DIAGNOSIS — T148XXA Other injury of unspecified body region, initial encounter: Secondary | ICD-10-CM

## 2012-08-09 DIAGNOSIS — E669 Obesity, unspecified: Secondary | ICD-10-CM | POA: Insufficient documentation

## 2012-08-09 DIAGNOSIS — X503XXA Overexertion from repetitive movements, initial encounter: Secondary | ICD-10-CM | POA: Insufficient documentation

## 2012-08-09 DIAGNOSIS — K219 Gastro-esophageal reflux disease without esophagitis: Secondary | ICD-10-CM | POA: Insufficient documentation

## 2012-08-09 DIAGNOSIS — Z8739 Personal history of other diseases of the musculoskeletal system and connective tissue: Secondary | ICD-10-CM | POA: Insufficient documentation

## 2012-08-09 DIAGNOSIS — IMO0002 Reserved for concepts with insufficient information to code with codable children: Secondary | ICD-10-CM | POA: Insufficient documentation

## 2012-08-09 DIAGNOSIS — Z8709 Personal history of other diseases of the respiratory system: Secondary | ICD-10-CM | POA: Insufficient documentation

## 2012-08-09 DIAGNOSIS — Z87891 Personal history of nicotine dependence: Secondary | ICD-10-CM | POA: Insufficient documentation

## 2012-08-09 DIAGNOSIS — Y9289 Other specified places as the place of occurrence of the external cause: Secondary | ICD-10-CM | POA: Insufficient documentation

## 2012-08-09 LAB — URINALYSIS, ROUTINE W REFLEX MICROSCOPIC
Bilirubin Urine: NEGATIVE
Hgb urine dipstick: NEGATIVE
Nitrite: NEGATIVE
Specific Gravity, Urine: 1.026 (ref 1.005–1.030)
Urobilinogen, UA: 1 mg/dL (ref 0.0–1.0)
pH: 6.5 (ref 5.0–8.0)

## 2012-08-09 LAB — HEPATIC FUNCTION PANEL
ALT: 37 U/L (ref 0–53)
AST: 40 U/L — ABNORMAL HIGH (ref 0–37)
Bilirubin, Direct: <0.1 mg/dL (ref 0.0–0.3)
Total Protein: 7.8 g/dL (ref 6.0–8.3)

## 2012-08-09 LAB — CBC WITH DIFFERENTIAL/PLATELET
Basophils Absolute: 0 10*3/uL (ref 0.0–0.1)
Basophils Relative: 0 % (ref 0–1)
Lymphocytes Relative: 25 % (ref 12–46)
MCHC: 35.2 g/dL (ref 30.0–36.0)
Neutro Abs: 8.1 10*3/uL — ABNORMAL HIGH (ref 1.7–7.7)
Neutrophils Relative %: 66 % (ref 43–77)
Platelets: 262 10*3/uL (ref 150–400)
RDW: 13.8 % (ref 11.5–15.5)
WBC: 12.4 10*3/uL — ABNORMAL HIGH (ref 4.0–10.5)

## 2012-08-09 LAB — POCT I-STAT, CHEM 8
Chloride: 104 mEq/L (ref 96–112)
Creatinine, Ser: 0.7 mg/dL (ref 0.50–1.35)
Glucose, Bld: 110 mg/dL — ABNORMAL HIGH (ref 70–99)
HCT: 46 % (ref 39.0–52.0)
Hemoglobin: 15.6 g/dL (ref 13.0–17.0)
Potassium: 4 mEq/L (ref 3.5–5.1)
Sodium: 141 mEq/L (ref 135–145)

## 2012-08-09 MED ORDER — OXYCODONE-ACETAMINOPHEN 5-325 MG PO TABS
1.0000 | ORAL_TABLET | Freq: Once | ORAL | Status: AC
  Administered 2012-08-09: 1 via ORAL
  Filled 2012-08-09: qty 1

## 2012-08-09 MED ORDER — IBUPROFEN 600 MG PO TABS: 600.0000 mg | ORAL_TABLET | Freq: Four times a day (QID) | ORAL | Status: DC | PRN

## 2012-08-09 NOTE — ED Notes (Signed)
Rt. Upper quadrant pain that is radiating to back. More pain in abd.

## 2012-08-09 NOTE — Discharge Instructions (Signed)
Muscle Strain  A muscle strain (pulled muscle) happens when a muscle is over-stretched. Recovery usually takes 5 to 6 weeks.   HOME CARE   · Put ice on the injured area.  · Put ice in a plastic bag.  · Place a towel between your skin and the bag.  · Leave the ice on for 15 to 20 minutes at a time, every hour for the first 2 days.  · Do not use the muscle for several days or until your doctor says you can. Do not use the muscle if you have pain.  · Wrap the injured area with an elastic bandage for comfort. Do not put it on too tightly.  · Only take medicine as told by your doctor.  · Warm up before exercise. This helps prevent muscle strains.  GET HELP RIGHT AWAY IF:   There is increased pain or puffiness (swelling) in the affected area.  MAKE SURE YOU:   · Understand these instructions.  · Will watch your condition.  · Will get help right away if you are not doing well or get worse.  Document Released: 03/15/2008 Document Revised: 08/29/2011 Document Reviewed: 03/15/2008  [employer]® Patient Information ©2013 [employer].

## 2012-08-09 NOTE — ED Provider Notes (Signed)
History     CSN: [id number]  Arrival date & time 08/09/12  2011   First MD Initiated Contact with Patient 08/09/12 2114      Chief Complaint  Patient presents with  . Abdominal Pain    HPI Todd Velez is a 27 y.o. male who presented to the ED for concern of RUQ abdominal pain.  Patient reports intermittent RUQ abdominal pain.  Sharp, moderate in severity.  Started while doing heavy lifting building snowman.  No N/V/D.  No constipation.  No fever.  No changes in urination.  No other symptoms.   Past Medical History  Diagnosis Date  . Back pain   . Asthma     as a child had "episode" of  asthma, used inhaler "once"  . Sleep disorder     pt. reports having sleep study at 53 yrs., old, reports it was normal but he  reports he snores at night    . GERD (gastroesophageal reflux disease)     occas., last episode, 2 months ago  . Obesity     Past Surgical History  Procedure Laterality Date  . Tonsillectomy      as a 10 yr. old  . Lumbar laminectomy/decompression microdiscectomy  01/12/2012    Procedure: LUMBAR LAMINECTOMY/DECOMPRESSION MICRODISCECTOMY;  Surgeon: Emilee Hero, MD;  Location: [facility] OR;  Service: Orthopedics;  Laterality: Right;  Right sided lumbar 5-sacrum 1 microdisectomy    No family history on file.  History  Substance Use Topics  . Smoking status: Former Smoker    Quit date: 12/06/2011  . Smokeless tobacco: Not on file  . Alcohol Use: 7.2 oz/week    12 Cans of beer per week      Review of Systems  Constitutional: Negative for fever and chills.  HENT: Negative for congestion, sore throat and neck pain.   Respiratory: Negative for cough.   Gastrointestinal: Negative for nausea, vomiting, abdominal pain, diarrhea and constipation.  Endocrine: Negative for polyuria.  Genitourinary: Negative for dysuria and hematuria.  Skin: Negative for rash.  Neurological: Negative for headaches.  Psychiatric/Behavioral: Negative.   All other systems reviewed  and are negative.    Allergies  Hydrocodone bitart (antituss)  Home Medications   Current Outpatient Rx  Name  Route  Sig  Dispense  Refill  . ibuprofen (ADVIL,MOTRIN) 600 MG tablet   Oral   Take 1 tablet (600 mg total) by mouth every 6 (six) hours as needed for pain.   30 tablet   0     BP 140/87  Pulse 84  Temp(Src) 98.8 F (37.1 C) (Oral)  Resp 17  SpO2 99%  Physical Exam  Nursing note and vitals reviewed. Constitutional: He is oriented to person, place, and time. He appears well-developed and well-nourished. No distress.  HENT:  Head: Normocephalic and atraumatic.  Right Ear: External ear normal.  Left Ear: External ear normal.  Mouth/Throat: Oropharynx is clear and moist. No oropharyngeal exudate.  Eyes: Conjunctivae are normal. Pupils are equal, round, and reactive to light. Right eye exhibits no discharge.  Neck: Normal range of motion. Neck supple. No tracheal deviation present.  Cardiovascular: Normal rate, regular rhythm and intact distal pulses.   Pulmonary/Chest: Effort normal. No respiratory distress. He has no wheezes. He has no rales.  Abdominal: Soft. He exhibits no distension. There is tenderness in the right upper quadrant. There is no rigidity, no rebound, no guarding, no CVA tenderness, no tenderness at McBurney's point and negative Murphy's sign.  Musculoskeletal: Normal range  of motion.  Neurological: He is alert and oriented to person, place, and time.  Skin: Skin is warm and dry. No rash noted. He is not diaphoretic.  Psychiatric: He has a normal mood and affect.    ED Course  Procedures (including critical care time)  Labs Reviewed  CBC WITH DIFFERENTIAL - Abnormal; Notable for the following:    WBC 12.4 (*)    Neutro Abs 8.1 (*)    All other components within normal limits  HEPATIC FUNCTION PANEL - Abnormal; Notable for the following:    AST 40 (*)    Total Bilirubin 0.2 (*)    All other components within normal limits  POCT I-STAT,  CHEM 8 - Abnormal; Notable for the following:    Glucose, Bld 110 (*)    All other components within normal limits  URINALYSIS, ROUTINE W REFLEX MICROSCOPIC  LIPASE, BLOOD   US Abdomen Complete  08/09/2012  *RADIOLOGY REPORT*  Clinical Data:  Right upper quadrant pain  COMPLETE ABDOMINAL ULTRASOUND  Comparison:  None.  Findings: Technically challenging examination due to patient body habitus and limited acoustic windows.  Gallbladder:  Contracted.  No radiodense gallstones.  Negative sonographic Murphy's sign.  Common bile duct:  Measures 2 mm, within normal limits.  Liver:  Diffusely increased in echogenicity. Focal lesion detection is limited in this setting.  IVC:  Appears normal.  Pancreas:  Poorly visualized/nondiagnostic.  Spleen:  Measures 8.8 cm.  No focal abnormality.  Right Kidney:  Measures 13.1 cm.  No hydronephrosis or focal abnormality.  Left Kidney:  Measures 15.2 cm.  No hydronephrosis or focal abnormality.  Abdominal aorta:  No aneurysm identified.  IMPRESSION: Contracted gallbladder without sonographic evidence for cholecystitis.  Hepatic steatosis.  Nondiagnostic evaluation of the pancreas.   Original Report Authenticated By: Jearld Lesch, M.D.      1. Musculoskeletal strain       MDM  Todd Velez is a 27 y.o. male who presented to the ED for concern of RUQ pain.  Exam with reproducible pain.  Likely MSK.  Concern for hepatic involvement given family history and patient size, [place] checked and negative.  Pain treated with NSAIDs.  AST mildly elevated but likely 2/2 patient EtOH use.  Patient safe for discharge.  No evidence of pancreatitis.  Doubt kidney involvement given exam.  Patient discharged.      Arloa Koh, MD 08/09/12 [phone]

## 2012-08-11 NOTE — ED Provider Notes (Signed)
I saw and evaluated the patient, reviewed the resident's note and I agree with the findings and plan.  Pt with RUQ pain after shovelling snow. Labs and [place] neg for intraabdominal process.   Charles B. Bernette Mayers, MD 08/11/12 1316

## 2012-08-22 ENCOUNTER — Emergency Department (HOSPITAL_COMMUNITY)
Admission: EM | Admit: 2012-08-22 | Discharge: 2012-08-23 | Disposition: A | Discharge status: Home / Self Care | Payer: Self-pay | Attending: Emergency Medicine | Admitting: Emergency Medicine

## 2012-08-22 ENCOUNTER — Encounter (HOSPITAL_COMMUNITY): Payer: Self-pay | Admitting: Emergency Medicine

## 2012-08-22 DIAGNOSIS — R071 Chest pain on breathing: Secondary | ICD-10-CM | POA: Insufficient documentation

## 2012-08-22 DIAGNOSIS — Z8669 Personal history of other diseases of the nervous system and sense organs: Secondary | ICD-10-CM | POA: Insufficient documentation

## 2012-08-22 DIAGNOSIS — Z87891 Personal history of nicotine dependence: Secondary | ICD-10-CM | POA: Insufficient documentation

## 2012-08-22 DIAGNOSIS — Z8719 Personal history of other diseases of the digestive system: Secondary | ICD-10-CM | POA: Insufficient documentation

## 2012-08-22 DIAGNOSIS — R0789 Other chest pain: Secondary | ICD-10-CM

## 2012-08-22 DIAGNOSIS — Z8739 Personal history of other diseases of the musculoskeletal system and connective tissue: Secondary | ICD-10-CM | POA: Insufficient documentation

## 2012-08-22 DIAGNOSIS — J45909 Unspecified asthma, uncomplicated: Secondary | ICD-10-CM | POA: Insufficient documentation

## 2012-08-22 NOTE — ED Notes (Signed)
Patient complaining of right upper quadrant pain that radiates around his back that started February 14th; patient reports that he was seen here several weeks ago and diagnosed with a muscle strain (patient reports that he received an ultrasound while he was here).  Patient denies nausea, vomiting and diarrhea.  Patient alert and oriented x4; PERRL present.  Upon arrival to room, patient changed into gown; will continue to monitor.

## 2012-08-22 NOTE — ED Notes (Signed)
PT. REPORTS PERSISTENT RUQ PAIN FOR SEVERAL WEEKS , SEEN HERE DISCHARGED HOME DIAGNOSED WITH " PULLED MUSCLE" . DENIES NAUSEA/VOMITTING / DIARRHEA .

## 2012-08-23 ENCOUNTER — Emergency Department (HOSPITAL_COMMUNITY): Payer: Self-pay

## 2012-08-23 LAB — COMPREHENSIVE METABOLIC PANEL
ALT: 31 U/L (ref 0–53)
Albumin: 3.6 g/dL (ref 3.5–5.2)
Alkaline Phosphatase: 82 U/L (ref 39–117)
Chloride: 101 mEq/L (ref 96–112)
Glucose, Bld: 105 mg/dL — ABNORMAL HIGH (ref 70–99)
Potassium: 3.5 mEq/L (ref 3.5–5.1)
Sodium: 136 mEq/L (ref 135–145)
Total Bilirubin: 0.2 mg/dL — ABNORMAL LOW (ref 0.3–1.2)
Total Protein: 7.4 g/dL (ref 6.0–8.3)

## 2012-08-23 MED ORDER — IBUPROFEN 800 MG PO TABS: 800.0000 mg | ORAL_TABLET | Freq: Three times a day (TID) | ORAL | Status: DC

## 2012-08-23 MED ORDER — IBUPROFEN 800 MG PO TABS
800.0000 mg | ORAL_TABLET | Freq: Once | ORAL | Status: AC
  Administered 2012-08-23: 800 mg via ORAL
  Filled 2012-08-23: qty 1

## 2012-08-23 NOTE — ED Notes (Signed)
Patient currently resting quietly in bed; no respiratory or acute distress.  Updated patient on plan of care; informed patient that we are currently waiting on further orders/disposition from EDP.  Patient denies any needs at this time; will continue to monitor.

## 2012-08-23 NOTE — ED Notes (Signed)
Patient given copy of discharge paperwork; went over discharge instructions with patient.  Patient instructed to follow up with PCP, to take ibuprofen as directed, and to return to the ED for new, worsening, or concerning symptoms.

## 2012-08-23 NOTE — ED Notes (Signed)
Patient currently resting quietly in bed; no respiratory or acute distress noted.  Patient back from x-ray; denies any needs at this time; will continue to monitor.

## 2012-08-23 NOTE — ED Provider Notes (Signed)
History     CSN: [id number]  Arrival date & time 08/22/12  2329   First MD Initiated Contact with Patient 08/22/12 2348      Chief Complaint  Patient presents with  . Abdominal Pain    (Consider location/radiation/quality/duration/timing/severity/associated sxs/prior treatment) HPI Todd Velez is a 27 y.o. male who presents with severe right-sided rib/right upper quadrant abdominal pain it started after Valentine's Day this year, he says he noticed it after lifting a large snowballs building snowman. He denies any worsening after eating, it hurts on motion, is sharp and intermittent, it hurts on palpation.  No nausea or vomiting, diarrhea, no chest pain, no shortness of breath, denies any fevers, chills, dysuria, frequency or hematuria.  Past Medical History  Diagnosis Date  . Back pain   . Asthma     as a child had "episode" of  asthma, used inhaler "once"  . Sleep disorder     pt. reports having sleep study at 44 yrs., old, reports it was normal but he  reports he snores at night    . GERD (gastroesophageal reflux disease)     occas., last episode, 2 months ago  . Obesity     Past Surgical History  Procedure Laterality Date  . Tonsillectomy      as a 10 yr. old  . Lumbar laminectomy/decompression microdiscectomy  01/12/2012    Procedure: LUMBAR LAMINECTOMY/DECOMPRESSION MICRODISCECTOMY;  Surgeon: Emilee Hero, MD;  Location: [facility] OR;  Service: Orthopedics;  Laterality: Right;  Right sided lumbar 5-sacrum 1 microdisectomy    No family history on file.  History  Substance Use Topics  . Smoking status: Former Smoker    Quit date: 12/06/2011  . Smokeless tobacco: Not on file  . Alcohol Use: 7.2 oz/week    12 Cans of beer per week      Review of Systems At least 10pt or greater review of systems completed and are negative except where specified in the HPI.  Allergies  Hydrocodone bitart (antituss)  Home Medications  No current outpatient prescriptions on  file.  BP 163/72  Pulse 89  Temp(Src) 98.5 F (36.9 C) (Oral)  Resp 18  SpO2 97%  Physical Exam  Nursing notes reviewed.  Electronic medical record reviewed. VITAL SIGNS:   Filed Vitals:   08/22/12 2332 08/23/12 0232  BP: 163/72 127/61  Pulse: 89 70  Temp: 98.5 F (36.9 C)   TempSrc: Oral   Resp: 18 20  SpO2: 97% 98%   CONSTITUTIONAL: Awake, oriented, appears non-toxic HENT: Atraumatic, normocephalic, oral mucosa pink and moist, airway patent. Nares patent without drainage. External ears normal. EYES: Conjunctiva clear, EOMI, PERRLA NECK: Trachea midline, non-tender, supple CARDIOVASCULAR: Normal heart rate, Normal rhythm, No murmurs, rubs, gallops PULMONARY/CHEST: Clear to auscultation, no rhonchi, wheezes, or rales. Symmetrical breath sounds. Tenderness to palpation along the costal angle on the right from the mid clavicular line to the midaxillary line, is tender to palpation along the ribs as well. ABDOMINAL: Non-distended, morbidly obese, soft, non-tender - no rebound or guarding.  BS normal. NEUROLOGIC: Non-focal, moving all four extremities, no gross sensory or motor deficits. EXTREMITIES: No clubbing, cyanosis, or edema SKIN: Warm, Dry, No erythema, No rash  ED Course  Procedures (including critical care time)  Labs Reviewed  COMPREHENSIVE METABOLIC PANEL - Abnormal; Notable for the following:    Glucose, Bld 105 (*)    Total Bilirubin 0.2 (*)    All other components within normal limits  D-DIMER, QUANTITATIVE  LIPASE, BLOOD  BILIRUBIN, DIRECT   No results found. No results found. Dg Chest 2 View  08/23/2012  *RADIOLOGY REPORT*  Clinical Data: Shortness of breath and right upper quadrant abdominal pain.  CHEST - 2 VIEW  Comparison: Chest radiograph performed 06/08/2012  Findings: The lungs are well-aerated and clear.  There is no evidence of focal opacification, pleural effusion or pneumothorax.  The heart is borderline normal in size; the mediastinal contour  is within normal limits.  No acute osseous abnormalities are seen.  IMPRESSION: No acute cardiopulmonary process seen.   Original Report Authenticated By: Tonia Ghent, M.D.     1. Chest wall pain       MDM  Todd Velez is a 27 y.o. male presenting with right rib pain and right upper cord or pain. Patient was seen for this after he initially did, ultrasound of the right upper quadrant showed no gallstones, no biliary disease. Patient has mostly chest wall pain, he has not taken anything for this. Basic labs including liver function testing is unremarkable today. Chest x-ray is also unremarkable.  Do not think this patient has an acute intrathoracic or intra-abdominal process at this time, but he has some chest wall pain, likely related to chest wall strain - possible some minor muscle tears of intercostals vs. Serratus.   Patient is an otherwise young healthy male, morbidly obese, I do not think an EKG or any further laboratory workup is indicated at this time. Physical exam is most consistent with musculoskeletal pain. Patient can use ibuprofen as needed, he's not been taking anything for this pain. Patient be discharged home stable and good condition. Medical plan has been explained to the patient as dictated, he understands and agrees with medical plan, as questions have been answered.       Jones Skene, MD 08/25/12 [phone]

## 2013-01-31 ENCOUNTER — Emergency Department (HOSPITAL_COMMUNITY)
Admission: EM | Admit: 2013-01-31 | Discharge: 2013-01-31 | Disposition: A | Discharge status: Home / Self Care | Payer: Self-pay | Attending: Emergency Medicine | Admitting: Emergency Medicine

## 2013-01-31 ENCOUNTER — Encounter (HOSPITAL_COMMUNITY): Payer: Self-pay

## 2013-01-31 DIAGNOSIS — J45909 Unspecified asthma, uncomplicated: Secondary | ICD-10-CM | POA: Insufficient documentation

## 2013-01-31 DIAGNOSIS — R509 Fever, unspecified: Secondary | ICD-10-CM | POA: Insufficient documentation

## 2013-01-31 DIAGNOSIS — Z8719 Personal history of other diseases of the digestive system: Secondary | ICD-10-CM | POA: Insufficient documentation

## 2013-01-31 DIAGNOSIS — L02219 Cutaneous abscess of trunk, unspecified: Secondary | ICD-10-CM | POA: Insufficient documentation

## 2013-01-31 DIAGNOSIS — Z8669 Personal history of other diseases of the nervous system and sense organs: Secondary | ICD-10-CM | POA: Insufficient documentation

## 2013-01-31 DIAGNOSIS — L0390 Cellulitis, unspecified: Secondary | ICD-10-CM

## 2013-01-31 DIAGNOSIS — E669 Obesity, unspecified: Secondary | ICD-10-CM | POA: Insufficient documentation

## 2013-01-31 DIAGNOSIS — Z87891 Personal history of nicotine dependence: Secondary | ICD-10-CM | POA: Insufficient documentation

## 2013-01-31 LAB — GLUCOSE, CAPILLARY: Glucose-Capillary: 98 mg/dL (ref 70–99)

## 2013-01-31 MED ORDER — SULFAMETHOXAZOLE-TRIMETHOPRIM 800-160 MG PO TABS: 1.0000 | ORAL_TABLET | Freq: Two times a day (BID) | ORAL | Status: DC

## 2013-01-31 MED ORDER — SULFAMETHOXAZOLE-TMP DS 800-160 MG PO TABS
1.0000 | ORAL_TABLET | Freq: Once | ORAL | Status: AC
  Administered 2013-01-31: 1 via ORAL
  Filled 2013-01-31: qty 1

## 2013-01-31 NOTE — ED Notes (Signed)
cbg 98 

## 2013-01-31 NOTE — ED Provider Notes (Signed)
CSN: [id number]     Arrival date & time 01/31/13  1842 History     First MD Initiated Contact with Patient 01/31/13 1919     Chief Complaint  Patient presents with  . Recurrent Skin Infections   (Consider location/radiation/quality/duration/timing/severity/associated sxs/prior Treatment) HPI Comments: Todd Velez is a 27 y.o. Male presenting with a small pimple like lesion in his left groin 2 days ago which has developed into a spreading red  Rash across his suprapubic area.  He has increased pain and soreness across the site which is worse with palpation.  It does not radiate into his scrotum.  He has had subjective fever since yesterday describing several episodes of shaking chills followed by flushing.  He has taken no medicines for his symptoms.     The history is provided by the patient.    Past Medical History  Diagnosis Date  . Back pain   . Asthma     as a child had "episode" of  asthma, used inhaler "once"  . Sleep disorder     pt. reports having sleep study at 72 yrs., old, reports it was normal but he  reports he snores at night    . GERD (gastroesophageal reflux disease)     occas., last episode, 2 months ago  . Obesity    Past Surgical History  Procedure Laterality Date  . Tonsillectomy      as a 10 yr. old  . Lumbar laminectomy/decompression microdiscectomy  01/12/2012    Procedure: LUMBAR LAMINECTOMY/DECOMPRESSION MICRODISCECTOMY;  Surgeon: Emilee Hero, MD;  Location: [facility] OR;  Service: Orthopedics;  Laterality: Right;  Right sided lumbar 5-sacrum 1 microdisectomy   No family history on file. History  Substance Use Topics  . Smoking status: Former Smoker    Quit date: 12/06/2011  . Smokeless tobacco: Not on file  . Alcohol Use: 7.2 oz/week    12 Cans of beer per week    Review of Systems  Constitutional: Negative for fever and chills.  HENT: Negative for facial swelling.   Respiratory: Negative for shortness of breath and wheezing.   Skin:  Positive for color change.  Neurological: Negative for numbness.    Allergies  Hydrocodone bitart (antituss)  Home Medications   Current Outpatient Rx  Name  Route  Sig  Dispense  Refill  . ibuprofen (ADVIL,MOTRIN) 800 MG tablet   Oral   Take 1 tablet (800 mg total) by mouth 3 (three) times daily.   21 tablet   0   . sulfamethoxazole-trimethoprim (SEPTRA DS) 800-160 MG per tablet   Oral   Take 1 tablet by mouth every 12 (twelve) hours.   20 tablet   0    BP 137/67  Pulse 89  Temp(Src) 98.9 F (37.2 C) (Oral)  Resp 16  Ht 5\' 4"  (1.626 m)  Wt 300 lb (136.079 kg)  BMI 51.47 kg/m2  SpO2 98% Physical Exam  Constitutional: He appears well-developed and well-nourished. No distress.  HENT:  Head: Normocephalic.  Neck: Neck supple.  Cardiovascular: Normal rate.   Pulmonary/Chest: Effort normal. He has no wheezes.  Musculoskeletal: Normal range of motion. He exhibits no edema.  Skin: There is erythema.  Small indurated nodule pea sized nodule in left suprapubic area, no fluctuance or drainage,  No punctum.  Redness spread across to mid right suprapubic space.  No involvement with scrotum or penis.      ED Course   Procedures (including critical care time)  Labs Reviewed - No data  to display No results found. 1. Cellulitis     MDM  Patient was seen by Dr. Bebe Shaggy prior to discharge.  He was prescribed Bactrim, first dose given here.  Encouraged warm compresses several times daily.  Recheck here if not improving or if symptoms worsen over the next 2 days.  Patients labs and/or radiological studies were viewed and considered during the medical decision making and disposition process.   cbg 98.  The patient appears reasonably screened and/or stabilized for discharge and I doubt any other medical condition or other [facility] requiring further screening, evaluation, or treatment in the ED at this time prior to discharge.   Burgess Amor, PA-C 01/31/13 2011  Burgess Amor,  PA-C 01/31/13 2012

## 2013-01-31 NOTE — ED Notes (Addendum)
Dr Bebe Shaggy and J Idol in to see pt.  Red area pelvic region..  Present for 1 week.

## 2013-01-31 NOTE — ED Notes (Signed)
Onset of abscess inner left thigh 2 days ago

## 2013-02-01 NOTE — ED Provider Notes (Signed)
Medical screening examination/treatment/procedure(s) were conducted as a shared visit with non-physician practitioner(s) and myself.  I personally evaluated the patient during the encounter  Pt well appearing, feel he is safe/stable for outpatient management of cellulitis Pt agreeable  Joya Gaskins, MD 02/01/13 0030

## 2013-02-02 ENCOUNTER — Emergency Department (HOSPITAL_COMMUNITY)
Admission: EM | Admit: 2013-02-02 | Discharge: 2013-02-02 | Disposition: A | Discharge status: Home / Self Care | Payer: Self-pay | Attending: Emergency Medicine | Admitting: Emergency Medicine

## 2013-02-02 ENCOUNTER — Encounter (HOSPITAL_COMMUNITY): Payer: Self-pay | Admitting: *Deleted

## 2013-02-02 DIAGNOSIS — L03319 Cellulitis of trunk, unspecified: Secondary | ICD-10-CM | POA: Insufficient documentation

## 2013-02-02 DIAGNOSIS — L0390 Cellulitis, unspecified: Secondary | ICD-10-CM

## 2013-02-02 DIAGNOSIS — K219 Gastro-esophageal reflux disease without esophagitis: Secondary | ICD-10-CM | POA: Insufficient documentation

## 2013-02-02 DIAGNOSIS — L02219 Cutaneous abscess of trunk, unspecified: Secondary | ICD-10-CM | POA: Insufficient documentation

## 2013-02-02 DIAGNOSIS — Z87891 Personal history of nicotine dependence: Secondary | ICD-10-CM | POA: Insufficient documentation

## 2013-02-02 MED ORDER — TRAMADOL HCL 50 MG PO TABS: 50.0000 mg | ORAL_TABLET | Freq: Four times a day (QID) | ORAL | Status: DC | PRN

## 2013-02-02 MED ORDER — CLINDAMYCIN PHOSPHATE 600 MG/50ML IV SOLN
600.0000 mg | Freq: Once | INTRAVENOUS | Status: AC
  Administered 2013-02-02: 600 mg via INTRAVENOUS
  Filled 2013-02-02: qty 50

## 2013-02-02 MED ORDER — CLINDAMYCIN HCL 300 MG PO CAPS: 300.0000 mg | ORAL_CAPSULE | Freq: Four times a day (QID) | ORAL | Status: DC

## 2013-02-02 MED ORDER — HYDROCODONE-ACETAMINOPHEN 5-325 MG PO TABS: 2.0000 | ORAL_TABLET | ORAL | Status: DC | PRN

## 2013-02-02 NOTE — ED Provider Notes (Signed)
CSN: [id number]     Arrival date & time 02/02/13  1520 History     First MD Initiated Contact with Patient 02/02/13 1530     Chief Complaint  Patient presents with  . Cellulitis   (Consider location/radiation/quality/duration/timing/severity/associated sxs/prior Treatment) Patient is a 27 y.o. male presenting with rash. The history is provided by the patient.  Rash Location:  Ano-genital Ano-genital rash location: over inguinal ligament area to the left side. Quality: burning, painful, redness and swelling   Pain details:    Quality:  Pressure and burning   Severity:  Moderate   Onset quality:  Gradual   Duration:  4 days   Timing:  Constant   Progression:  Worsening Associated symptoms: no abdominal pain, no fatigue, no fever, no headaches, no nausea, no shortness of breath, no sore throat, not vomiting and not wheezing     Past Medical History  Diagnosis Date  . Back pain   . Asthma     as a child had "episode" of  asthma, used inhaler "once"  . Sleep disorder     pt. reports having sleep study at 68 yrs., old, reports it was normal but he  reports he snores at night    . GERD (gastroesophageal reflux disease)     occas., last episode, 2 months ago  . Obesity    Past Surgical History  Procedure Laterality Date  . Tonsillectomy      as a 10 yr. old  . Lumbar laminectomy/decompression microdiscectomy  01/12/2012    Procedure: LUMBAR LAMINECTOMY/DECOMPRESSION MICRODISCECTOMY;  Surgeon: Emilee Hero, MD;  Location: [facility] OR;  Service: Orthopedics;  Laterality: Right;  Right sided lumbar 5-sacrum 1 microdisectomy   History reviewed. No pertinent family history. History  Substance Use Topics  . Smoking status: Former Smoker    Quit date: 12/06/2011  . Smokeless tobacco: Not on file  . Alcohol Use: 7.2 oz/week    12 Cans of beer per week    Review of Systems  Constitutional: Negative for fever, activity change, appetite change and fatigue.  HENT: Negative for  congestion, sore throat, facial swelling, rhinorrhea, trouble swallowing, neck pain, neck stiffness, voice change and sinus pressure.   Eyes: Negative.   Respiratory: Negative for cough, choking, chest tightness, shortness of breath and wheezing.   Cardiovascular: Negative for chest pain.  Gastrointestinal: Negative for nausea, vomiting and abdominal pain.  Genitourinary: Negative for dysuria, urgency, frequency, hematuria, flank pain and difficulty urinating.  Musculoskeletal: Negative for back pain and gait problem.  Skin: Positive for rash. Negative for wound.  Neurological: Negative for facial asymmetry, weakness, numbness and headaches.  Psychiatric/Behavioral: Negative for behavioral problems, confusion and agitation. The patient is not nervous/anxious and is not hyperactive.   All other systems reviewed and are negative.    Allergies  Hydrocodone bitart (antituss)  Home Medications   Current Outpatient Rx  Name  Route  Sig  Dispense  Refill  . acetaminophen (TYLENOL) 500 MG tablet   Oral   Take 1,000 mg by mouth every 6 (six) hours as needed for pain.         Marland Kitchen sulfamethoxazole-trimethoprim (SEPTRA DS) 800-160 MG per tablet   Oral   Take 1 tablet by mouth every 12 (twelve) hours.   20 tablet   0   . clindamycin (CLEOCIN) 300 MG capsule   Oral   Take 1 capsule (300 mg total) by mouth 4 (four) times daily. X 7 days   28 capsule   0  BP 112/57  Pulse 92  Temp(Src) 99.5 F (37.5 C) (Oral)  Resp 20  SpO2 98% Physical Exam  Nursing note and vitals reviewed. Constitutional: He is oriented to person, place, and time. He appears well-developed and well-nourished. No distress.  HENT:  Head: Normocephalic and atraumatic.  Right Ear: External ear normal.  Left Ear: External ear normal.  Mouth/Throat: No oropharyngeal exudate.  Eyes: Conjunctivae and EOM are normal. Pupils are equal, round, and reactive to light. Right eye exhibits no discharge. Left eye exhibits no  discharge.  Neck: Normal range of motion. Neck supple. No JVD present. No tracheal deviation present. No thyromegaly present.  Cardiovascular: Normal rate, regular rhythm, normal heart sounds and intact distal pulses.  Exam reveals no gallop and no friction rub.   No murmur heard. Pulmonary/Chest: Effort normal and breath sounds normal. No respiratory distress. He has no wheezes. He exhibits no tenderness.  Abdominal: Soft. Bowel sounds are normal. He exhibits no distension. There is no tenderness. There is no rebound and no guarding.  Musculoskeletal: Normal range of motion. He exhibits no edema and no tenderness.  Lymphadenopathy:    He has no cervical adenopathy.  Neurological: He is alert and oriented to person, place, and time. No cranial nerve deficit.  Skin: Skin is warm and dry. Rash noted. He is not diaphoretic. No pallor.     Psychiatric: He has a normal mood and affect. His behavior is normal.    ED Course   Procedures (including critical care time)  Labs Reviewed - No data to display No results found. 1. Cellulitis     MDM  27 yr old M patient here with what appears to be cellulitis described above. I did an bedside US to look for abscess but there was none, just changes consistent with cellulitis. Patient was given bactrim two days ago for this infection and his rash has not improved. Could be that infecting organism is a strep species not well covered by bactrim. Will give the patient IV clindamycin then prescribe oral clinda as it likely will cover strep flora. Patient will have strict return precautions with any worsening of the rash.  Case discussed with Dr. Rob Bunting, MD 02/02/13 [phone]

## 2013-02-02 NOTE — ED Notes (Addendum)
Pt reports having been to [facility] two days ago and diagnosed with cellulitis to left groin area, reports no relief with antibiotics and reports fever pta.

## 2013-02-02 NOTE — Discharge Instructions (Signed)
Cellulitis Cellulitis is an infection of the skin and the tissue beneath it. The infected area is usually red and tender. Cellulitis occurs most often in the arms and lower legs.  CAUSES  Cellulitis is caused by bacteria that enter the skin through cracks or cuts in the skin. The most common types of bacteria that cause cellulitis are Staphylococcus and Streptococcus. SYMPTOMS   Redness and warmth.  Swelling.  Tenderness or pain.  Fever. DIAGNOSIS  Your caregiver can usually determine what is wrong based on a physical exam. Blood tests may also be done. TREATMENT  Treatment usually involves taking an antibiotic medicine. HOME CARE INSTRUCTIONS   Take your antibiotics as directed. Finish them even if you start to feel better.  Keep the infected arm or leg elevated to reduce swelling.  Apply a warm cloth to the affected area up to 4 times per day to relieve pain.  Only take over-the-counter or prescription medicines for pain, discomfort, or fever as directed by your caregiver.  Keep all follow-up appointments as directed by your caregiver. SEEK MEDICAL CARE IF:   You notice red streaks coming from the infected area.  Your red area gets larger or turns dark in color.  Your bone or joint underneath the infected area becomes painful after the skin has healed.  Your infection returns in the same area or another area.  You notice a swollen bump in the infected area.  You develop new symptoms. SEEK IMMEDIATE MEDICAL CARE IF:   You have a fever.  You feel very sleepy.  You develop vomiting or diarrhea.  You have a general ill feeling (malaise) with muscle aches and pains. MAKE SURE YOU:   Understand these instructions.  Will watch your condition.  Will get help right away if you are not doing well or get worse. Document Released: 03/16/2005 Document Revised: 12/06/2011 Document Reviewed: 08/22/2011 [employer] Patient Information 2014 [employer].  

## 2013-02-02 NOTE — ED Notes (Signed)
Pt undressing and getting into a gown at this time 

## 2013-02-03 NOTE — ED Provider Notes (Signed)
I saw and evaluated the patient, reviewed the resident's note and I agree with the findings and plan.  Patient presents with redness and swelling in the suprapubic region.  Seen several days ago and put on Bactrim.  Worsening redness.  Denies fevers.  Patient's exam reveals cellulitis of the suprapubic region midline and left.  It does not extend to the genitals or perineum.  THere is no evidence of crepitus.  Low suspicion for Fourn[place].  Patient is not being adequately covered for strep species.  Given one IV dose of clindamycin.  Patient was offered admission but wishes to try outpatient management.  Will be d/c'd on clindamycin.  After history, exam, and medical workup I feel the patient has been appropriately medically screened and is safe for discharge home. Pertinent diagnoses were discussed with the patient. Patient was given return precautions.   Shon Baton, MD 02/03/13 [phone]

## 2013-02-04 ENCOUNTER — Encounter (HOSPITAL_COMMUNITY): Payer: Self-pay | Admitting: Nurse Practitioner

## 2013-02-04 ENCOUNTER — Inpatient Hospital Stay (HOSPITAL_COMMUNITY)
Admission: EM | Admit: 2013-02-04 | Discharge: 2013-02-08 | DRG: 603 | Disposition: A | Discharge status: Home Health | Payer: MEDICAID | Attending: Internal Medicine | Admitting: Internal Medicine

## 2013-02-04 DIAGNOSIS — L02419 Cutaneous abscess of limb, unspecified: Secondary | ICD-10-CM | POA: Diagnosis present

## 2013-02-04 DIAGNOSIS — L738 Other specified follicular disorders: Secondary | ICD-10-CM | POA: Diagnosis present

## 2013-02-04 DIAGNOSIS — L02219 Cutaneous abscess of trunk, unspecified: Principal | ICD-10-CM | POA: Diagnosis present

## 2013-02-04 DIAGNOSIS — Z87891 Personal history of nicotine dependence: Secondary | ICD-10-CM

## 2013-02-04 DIAGNOSIS — K219 Gastro-esophageal reflux disease without esophagitis: Secondary | ICD-10-CM | POA: Diagnosis present

## 2013-02-04 DIAGNOSIS — G4730 Sleep apnea, unspecified: Secondary | ICD-10-CM | POA: Diagnosis present

## 2013-02-04 DIAGNOSIS — E669 Obesity, unspecified: Secondary | ICD-10-CM

## 2013-02-04 DIAGNOSIS — Z885 Allergy status to narcotic agent status: Secondary | ICD-10-CM

## 2013-02-04 DIAGNOSIS — A4902 Methicillin resistant Staphylococcus aureus infection, unspecified site: Secondary | ICD-10-CM | POA: Diagnosis present

## 2013-02-04 DIAGNOSIS — L03319 Cellulitis of trunk, unspecified: Secondary | ICD-10-CM

## 2013-02-04 DIAGNOSIS — F1210 Cannabis abuse, uncomplicated: Secondary | ICD-10-CM | POA: Diagnosis present

## 2013-02-04 DIAGNOSIS — L0291 Cutaneous abscess, unspecified: Secondary | ICD-10-CM | POA: Diagnosis present

## 2013-02-04 DIAGNOSIS — L03119 Cellulitis of unspecified part of limb: Secondary | ICD-10-CM

## 2013-02-04 DIAGNOSIS — Z6841 Body Mass Index (BMI) 40.0 and over, adult: Secondary | ICD-10-CM

## 2013-02-04 DIAGNOSIS — L0390 Cellulitis, unspecified: Secondary | ICD-10-CM

## 2013-02-04 DIAGNOSIS — J45909 Unspecified asthma, uncomplicated: Secondary | ICD-10-CM

## 2013-02-04 HISTORY — DX: Other complications of anesthesia, initial encounter: T88.59XA

## 2013-02-04 HISTORY — DX: Adverse effect of unspecified anesthetic, initial encounter: T41.45XA

## 2013-02-04 HISTORY — DX: Sciatica, right side: M54.31

## 2013-02-04 HISTORY — DX: Cellulitis of groin: L03.314

## 2013-02-04 LAB — CBC WITH DIFFERENTIAL/PLATELET
HCT: 39.9 % (ref 39.0–52.0)
Hemoglobin: 14.1 g/dL (ref 13.0–17.0)
Lymphocytes Relative: 12 % (ref 12–46)
Lymphs Abs: 2 10*3/uL (ref 0.7–4.0)
Monocytes Absolute: 1.1 10*3/uL — ABNORMAL HIGH (ref 0.1–1.0)
Monocytes Relative: 7 % (ref 3–12)
Neutro Abs: 13.1 10*3/uL — ABNORMAL HIGH (ref 1.7–7.7)
WBC: 16.6 10*3/uL — ABNORMAL HIGH (ref 4.0–10.5)

## 2013-02-04 LAB — BASIC METABOLIC PANEL
BUN: 8 mg/dL (ref 6–23)
CO2: 28 mEq/L (ref 19–32)
Chloride: 97 mEq/L (ref 96–112)
Creatinine, Ser: 0.57 mg/dL (ref 0.50–1.35)
Glucose, Bld: 141 mg/dL — ABNORMAL HIGH (ref 70–99)

## 2013-02-04 MED ORDER — DOCUSATE SODIUM 100 MG PO CAPS
100.0000 mg | ORAL_CAPSULE | Freq: Two times a day (BID) | ORAL | Status: DC
  Administered 2013-02-04: 100 mg via ORAL
  Filled 2013-02-04: qty 1

## 2013-02-04 MED ORDER — POTASSIUM CHLORIDE CRYS ER 20 MEQ PO TBCR
40.0000 meq | EXTENDED_RELEASE_TABLET | Freq: Every day | ORAL | Status: DC
  Filled 2013-02-04 (×2): qty 2

## 2013-02-04 MED ORDER — VANCOMYCIN HCL 10 G IV SOLR
1250.0000 mg | Freq: Two times a day (BID) | INTRAVENOUS | Status: DC
  Administered 2013-02-05 – 2013-02-07 (×5): 1250 mg via INTRAVENOUS
  Filled 2013-02-04 (×6): qty 1250

## 2013-02-04 MED ORDER — HYDROMORPHONE HCL PF 1 MG/ML IJ SOLN
1.0000 mg | INTRAMUSCULAR | Status: DC | PRN
  Administered 2013-02-04 – 2013-02-05 (×2): 1 mg via INTRAVENOUS
  Filled 2013-02-04 (×2): qty 1

## 2013-02-04 MED ORDER — VANCOMYCIN HCL 10 G IV SOLR
2500.0000 mg | Freq: Once | INTRAVENOUS | Status: AC
  Administered 2013-02-04: 2500 mg via INTRAVENOUS
  Filled 2013-02-04: qty 2500

## 2013-02-04 MED ORDER — ONDANSETRON HCL 4 MG/2ML IJ SOLN: 4.0000 mg | Freq: Four times a day (QID) | INTRAMUSCULAR | Status: DC | PRN

## 2013-02-04 MED ORDER — DEXTROSE-NACL 5-0.9 % IV SOLN
INTRAVENOUS | Status: DC
  Administered 2013-02-04: via INTRAVENOUS

## 2013-02-04 MED ORDER — FAMOTIDINE 20 MG PO TABS
20.0000 mg | ORAL_TABLET | Freq: Every day | ORAL | Status: DC
  Administered 2013-02-04 – 2013-02-07 (×4): 20 mg via ORAL
  Filled 2013-02-04 (×7): qty 1

## 2013-02-04 MED ORDER — ONDANSETRON HCL 4 MG PO TABS: 4.0000 mg | ORAL_TABLET | Freq: Four times a day (QID) | ORAL | Status: DC | PRN

## 2013-02-04 MED ORDER — CLINDAMYCIN PHOSPHATE 600 MG/50ML IV SOLN
600.0000 mg | Freq: Four times a day (QID) | INTRAVENOUS | Status: DC
  Administered 2013-02-05 – 2013-02-08 (×14): 600 mg via INTRAVENOUS
  Filled 2013-02-04 (×16): qty 50

## 2013-02-04 MED ORDER — HEPARIN SODIUM (PORCINE) 5000 UNIT/ML IJ SOLN
5000.0000 [IU] | Freq: Three times a day (TID) | INTRAMUSCULAR | Status: DC
  Administered 2013-02-04: 5000 [IU] via SUBCUTANEOUS
  Filled 2013-02-04 (×4): qty 1

## 2013-02-04 MED ORDER — ACETAMINOPHEN 500 MG PO TABS: 500.0000 mg | ORAL_TABLET | Freq: Four times a day (QID) | ORAL | Status: DC | PRN

## 2013-02-04 NOTE — Consult Note (Signed)
Reason for Consult:left groin cellulitis, concern for abscess Referring Physician: Dr Dayna Ramus  Todd Velez is an 27 y.o. male.  HPI: 27 year old morbidly obese male presented to the emergency department for followup exam because of worsening left groin cellulitis and discomfort. The patient states that he developed what appeared to be an infected ingrown hair in his left groin about one week ago. There was a small amount of surrounding redness at that time. He presented to the emergency department on August 14 initially. He was given a dose of IV antibiotics and prescription for oral antibiotics, Bactrim and discharged. The erythema worsened and he returned on the 16th. It was recommended that he be admitted for IV antibiotics however the patient declined and wanted to continue with outpatient management. He did receive another dose of IV antibiotics in the emergency room at that time. His oral antibiotic coverage was broadened as well. He came back today because of worsening redness and persistent discomfort. He also endorses some subjective fevers and chills. He denies any trauma to the area. He denies any drainage. He denies any similar lesions. He denies any dysuria or urinary discomfort. He denies any melena or hematochezia. He denies any lower extremity weakness, numbness or tingling. He denies any hematuria. He denies any nausea or vomiting. He ate part of a cheeseburger and drank some soda before coming to the ER  Past Medical History  Diagnosis Date  . Back pain   . Asthma     as a child had "episode" of  asthma, used inhaler "once"  . Sleep disorder     pt. reports having sleep study at 64 yrs., old, reports it was normal but he  reports he snores at night    . GERD (gastroesophageal reflux disease)     occas., last episode, 2 months ago  . Obesity     Past Surgical History  Procedure Laterality Date  . Tonsillectomy      as a 10 yr. old  . Lumbar laminectomy/decompression  microdiscectomy  01/12/2012    Procedure: LUMBAR LAMINECTOMY/DECOMPRESSION MICRODISCECTOMY;  Surgeon: Emilee Hero, MD;  Location: [facility] OR;  Service: Orthopedics;  Laterality: Right;  Right sided lumbar 5-sacrum 1 microdisectomy    History reviewed. No pertinent family history.  Social History:  reports that he quit smoking about 14 months ago. He does not have any smokeless tobacco history on file. He reports that he drinks about 7.2 ounces of alcohol per week. He reports that he uses illicit drugs (Marijuana).  Allergies:  Allergies  Allergen Reactions  . Hydrocodone Bitart (Antituss) [Hydrocodone] Itching  . Hydrocodone-Acetaminophen Itching    Medications: I have reviewed the patient's current medications.  Results for orders placed during the hospital encounter of 02/04/13 (from the past 48 hour(s))  CBC WITH DIFFERENTIAL     Status: Abnormal   Collection Time    02/04/13  6:37 PM      Result Value Range   WBC 16.6 (*) 4.0 - 10.5 K/uL   RBC 4.72  4.22 - 5.81 MIL/uL   Hemoglobin 14.1  13.0 - 17.0 g/dL   HCT 16.1  09.6 - 04.5 %   MCV 84.5  78.0 - 100.0 fL   MCH 29.9  26.0 - 34.0 pg   MCHC 35.3  30.0 - 36.0 g/dL   RDW 40.9  81.1 - 91.4 %   Platelets 236  150 - 400 K/uL   Neutrophils Relative % 79 (*) 43 - 77 %   Neutro  Abs 13.1 (*) 1.7 - 7.7 K/uL   Lymphocytes Relative 12  12 - 46 %   Lymphs Abs 2.0  0.7 - 4.0 K/uL   Monocytes Relative 7  3 - 12 %   Monocytes Absolute 1.1 (*) 0.1 - 1.0 K/uL   Eosinophils Relative 2  0 - 5 %   Eosinophils Absolute 0.3  0.0 - 0.7 K/uL   Basophils Relative 0  0 - 1 %   Basophils Absolute 0.0  0.0 - 0.1 K/uL  BASIC METABOLIC PANEL     Status: Abnormal   Collection Time    02/04/13  6:37 PM      Result Value Range   Sodium 135  135 - 145 mEq/L   Potassium 3.3 (*) 3.5 - 5.1 mEq/L   Chloride 97  96 - 112 mEq/L   CO2 28  19 - 32 mEq/L   Glucose, Bld 141 (*) 70 - 99 mg/dL   BUN 8  6 - 23 mg/dL   Creatinine, Ser 9.52  0.50 - 1.35  mg/dL   Calcium 9.0  8.4 - 84.1 mg/dL   GFR calc non Af Amer >90  >90 mL/min   GFR calc Af Amer >90  >90 mL/min   Comment: (NOTE)     The eGFR has been calculated using the CKD EPI equation.     This calculation has not been validated in all clinical situations.     eGFR's persistently <90 mL/min signify possible Chronic Kidney     Disease.    No results found.  Review of Systems  Constitutional: Positive for fever and chills. Negative for weight loss.  HENT: Negative for nosebleeds.   Eyes: Negative for blurred vision.  Respiratory: Negative for shortness of breath.   Cardiovascular: Negative for chest pain, palpitations, orthopnea and PND.       Denies DOE  Gastrointestinal: Negative for nausea and vomiting.       L groin pain and suprapubic pain  Genitourinary: Negative for dysuria, frequency and hematuria.  Musculoskeletal: Negative.        H/o lumbar slipped disk  Skin: Negative for itching and rash.  Neurological: Negative for dizziness, sensory change, focal weakness, seizures, loss of consciousness, weakness and headaches.       Denies TIAs, amaurosis fugax  Endo/Heme/Allergies: Does not bruise/bleed easily.  Psychiatric/Behavioral: The patient is not nervous/anxious.    Blood pressure 121/86, pulse 104, temperature 99.1 F (37.3 C), temperature source Oral, resp. rate 16, height 5\' 5"  (1.651 m), weight 300 lb (136.079 kg), SpO2 100.00%. Physical Exam  Vitals reviewed. Constitutional: He is oriented to person, place, and time. He appears well-developed. He is cooperative.  Non-toxic appearance. He does not have a sickly appearance. No distress.  Morbidly obese. Resting comfortably. NAD  HENT:  Head: Normocephalic and atraumatic.  Right Ear: External ear normal.  Left Ear: External ear normal.  Eyes: Conjunctivae are normal. No scleral icterus.  Neck: Normal range of motion. Neck supple. No tracheal deviation present. No thyromegaly present.  Cardiovascular: Normal  rate, regular rhythm and normal heart sounds.   Respiratory: Effort normal and breath sounds normal. No respiratory distress. He has no wheezes.  GI: Soft. He exhibits no distension. There is no tenderness. There is no rebound.  Genitourinary: Testes normal and penis normal.     Large left groin/suprapubic cellulitis extending down left ant thigh. Has about an 8 x6 cm area of induration in left groin area. No fluctuance. Not c/w nec fascia  Musculoskeletal: He exhibits no edema and no tenderness.  Lymphadenopathy:       Right: No inguinal adenopathy present.       Left: No inguinal adenopathy present.  Neurological: He is alert and oriented to person, place, and time. He exhibits normal muscle tone.  Skin: He is not diaphoretic.  Psychiatric: He has a normal mood and affect. His behavior is normal. Judgment and thought content normal.    Assessment/Plan: Left groin, suprapubic, anterior thigh cellulitis and abscess Morbid obesity Probable sleep apnea  I do recommend admission to the hospital for IV antibiotics. I believe he will need incision, drainage and probable debridement of his left suprapubic area where the area of maximal induration is located. On physical exam this is not consistent with necrotizing fasciitis. Unfortunately the patient did eat just prior to arrival to the emergency department. I don't think this is surgical emergency where we need to risk possible aspiration. He will be kept n.p.o.  We will reevaluate the area in the morning. However if he develops worsening pain, tachycardia, or worsening erythema in the night we will proceed to the operating room at that time. Even if he does respond to IV antibiotics with respect to cellulitis I think he may still require incision, drainage and debridement in the OR to expedite healing.  I explained that this would be a wound and it would take some time for it to heal. The patient agrees with the plan  Mary Sella. Andrey Campanile, MD,  FACS General, Bariatric, & Minimally Invasive Surgery [facility], [place]   [facility] M 02/04/2013, 9:56 PM

## 2013-02-04 NOTE — H&P (Signed)
[facility] History and Physical  Todd Velez WUJ:[id number] DOB: March 15, 1986    PCP:   None  Chief Complaint: left groin swelling.  HPI: Todd Velez is an 27 y.o. male with benign PMH (asthma, obesity, ?sleep apnea), returned to the ER with increased swelling and pain on his left groin.  He was seen in the ER, placed on Bactrim and given IV Clinda, advised admission previously, but preferred outpatient tx.  Evaluation in the ER included a leukocytosis with WBC of 16.6K, K of 3.3, normal renal fx tests.  Attempted aspiration in the ER didn't yield any purulent aspirate.  Hospitalist was asked to admit him for cellulitis.  Rewiew of Systems:  Constitutional: Negative for malaise.  No significant weight loss or weight gain Eyes: Negative for eye pain, redness and discharge, diplopia, visual changes, or flashes of light. ENMT: Negative for ear pain, hoarseness, nasal congestion, sinus pressure and sore throat. No headaches; tinnitus, drooling, or problem swallowing. Cardiovascular: Negative for chest pain, palpitations, diaphoresis, dyspnea and peripheral edema. ; No orthopnea, PND Respiratory: Negative for cough, hemoptysis, wheezing and stridor. No pleuritic chestpain. Gastrointestinal: Negative for nausea, vomiting, diarrhea, constipation, abdominal pain, melena, blood in stool, hematemesis, jaundice and rectal bleeding.    Genitourinary: Negative for frequency, dysuria, incontinence,flank pain and hematuria; Musculoskeletal: Negative for back pain and neck pain. Negative for swelling and trauma.;  Skin: . Swollen left groin, likely an abcess. Neuro: Negative for headache, lightheadedness and neck stiffness. Negative for weakness, altered level of consciousness , altered mental status, extremity weakness, burning feet, involuntary movement, seizure and syncope.  Psych: negative for anxiety, depression, insomnia, tearfulness, panic attacks, hallucinations, paranoia, suicidal or  homicidal ideation    Past Medical History  Diagnosis Date  . Back pain   . Asthma     as a child had "episode" of  asthma, used inhaler "once"  . Sleep disorder     pt. reports having sleep study at 82 yrs., old, reports it was normal but he  reports he snores at night    . GERD (gastroesophageal reflux disease)     occas., last episode, 2 months ago  . Obesity     Past Surgical History  Procedure Laterality Date  . Tonsillectomy      as a 10 yr. old  . Lumbar laminectomy/decompression microdiscectomy  01/12/2012    Procedure: LUMBAR LAMINECTOMY/DECOMPRESSION MICRODISCECTOMY;  Surgeon: Emilee Hero, MD;  Location: [facility] OR;  Service: Orthopedics;  Laterality: Right;  Right sided lumbar 5-sacrum 1 microdisectomy    Medications:  HOME MEDS: Prior to Admission medications   Medication Sig Start Date End Date Taking? Authorizing Provider  acetaminophen (TYLENOL) 500 MG tablet Take 1,000 mg by mouth every 6 (six) hours as needed for pain.   Yes Historical Provider, MD  clindamycin (CLEOCIN) 300 MG capsule Take 300 mg by mouth 4 (four) times daily. Started 02/02/13, for 7 days, ending 02/08/13.   Yes Historical Provider, MD  ranitidine (ZANTAC) 150 MG tablet Take 150 mg by mouth daily as needed for heartburn.   Yes Historical Provider, MD     Allergies:  Allergies  Allergen Reactions  . Hydrocodone Bitart (Antituss) [Hydrocodone] Itching  . Hydrocodone-Acetaminophen Itching    Social History:   reports that he quit smoking about 14 months ago. He does not have any smokeless tobacco history on file. He reports that he drinks about 7.2 ounces of alcohol per week. He reports that he uses illicit drugs (Marijuana).  Family History: History reviewed.  No pertinent family history.   Physical Exam: Filed Vitals:   02/04/13 1757  BP: 121/86  Pulse: 104  Temp: 99.1 F (37.3 C)  TempSrc: Oral  Resp: 16  Height: 5\' 5"  (1.651 m)  Weight: 136.079 kg (300 lb)  SpO2: 100%    Blood pressure 121/86, pulse 104, temperature 99.1 F (37.3 C), temperature source Oral, resp. rate 16, height 5\' 5"  (1.651 m), weight 136.079 kg (300 lb), SpO2 100.00%.  GEN:  Pleasant patient lying in the stretcher in no acute distress; cooperative with exam. PSYCH:  alert and oriented x4; does not appear anxious or depressed; affect is appropriate. HEENT: Mucous membranes pink and anicteric; PERRLA; EOM intact; no cervical lymphadenopathy nor thyromegaly or carotid bruit; no JVD; There were no stridor. Neck is very supple. Breasts:: Not examined CHEST WALL: No tenderness CHEST: Normal respiration, clear to auscultation bilaterally.  HEART: Regular rate and rhythm.  There are no murmur, rub, or gallops.   BACK: No kyphosis or scoliosis; no CVA tenderness ABDOMEN: soft and non-tender; no masses, no organomegaly, normal abdominal bowel sounds; no pannus; no intertriginous candida. There is no rebound and no distention. Rectal Exam: Not done EXTREMITIES: No bone or joint deformity; age-appropriate arthropathy of the hands and knees; no edema; no ulcerations.  There is no calf tenderness. Genitalia: He has swelling with induration on his left groin.  Likely an abscess. PULSES: 2+ and symmetric SKIN: Normal hydration no rash or ulceration CNS: Cranial nerves 2-12 grossly intact no focal lateralizing neurologic deficit.  Speech is fluent; uvula elevated with phonation, facial symmetry and tongue midline. DTR are normal bilaterally, cerebella exam is intact, barbinski is negative and strengths are equaled bilaterally.  No sensory loss.   Labs on Admission:  Basic Metabolic Panel:  Recent Labs Lab 02/04/13 1837  NA 135  K 3.3*  CL 97  CO2 28  GLUCOSE 141*  BUN 8  CREATININE 0.57  CALCIUM 9.0   Liver Function Tests: No results found for this basename: AST, ALT, ALKPHOS, BILITOT, PROT, ALBUMIN,  in the last 168 hours No results found for this basename: LIPASE, AMYLASE,  in the last  168 hours No results found for this basename: AMMONIA,  in the last 168 hours CBC:  Recent Labs Lab 02/04/13 1837  WBC 16.6*  NEUTROABS 13.1*  HGB 14.1  HCT 39.9  MCV 84.5  PLT 236   Cardiac Enzymes: No results found for this basename: CKTOTAL, CKMB, CKMBINDEX, TROPONINI,  in the last 168 hours  CBG:  Recent Labs Lab 01/31/13 2008  GLUCAP 98     Radiological Exams on Admission: No results found.  Assessment/Plan Present on Admission:  . Abscess and cellulitis . Asthma . Obesity  PLAN:  I think this gentleman has a groin abscess.  I will admit him and start Van/Clind IV, but he will need a surgical consultation.  I spoke with Dr Andrey Campanile who said he will kindly provide consultation.  In the interim, will give IVF, IV pain control, and NPO.  Will defer further imaging to Dr Andrey Campanile.  Thank you for allowing me to participate in his care.  Other plans as per orders.  Code Status: FULL Unk Lightning, MD. [facility] Pager [phone] 7pm to 7am.  02/04/2013, 8:17 PM

## 2013-02-04 NOTE — Progress Notes (Signed)
ANTIBIOTIC CONSULT NOTE - INITIAL  Pharmacy Consult for Vancomycin Indication: Worsening L groin cellulitis  Allergies  Allergen Reactions  . Hydrocodone Bitart (Antituss) [Hydrocodone] Itching  . Hydrocodone-Acetaminophen Itching    Patient Measurements: Height: 5\' 5"  (165.1 cm) Weight: 300 lb (136.079 kg) IBW/kg (Calculated) : 61.5 Adjusted Body Weight: n/a  Vital Signs: Temp: 99.1 F (37.3 C) (08/18 1757) Temp src: Oral (08/18 1757) BP: 121/86 mmHg (08/18 1757) Pulse Rate: 104 (08/18 1757) Intake/Output from previous day:   Intake/Output from this shift:    Labs:  Recent Labs  02/04/13 1837  WBC 16.6*  HGB 14.1  PLT 236  CREATININE 0.57   Estimated Creatinine Clearance: 180.7 ml/min (by C-G formula based on Cr of 0.57). No results found for this basename: VANCOTROUGH, VANCOPEAK, VANCORANDOM, GENTTROUGH, GENTPEAK, GENTRANDOM, TOBRATROUGH, TOBRAPEAK, TOBRARND, AMIKACINPEAK, AMIKACINTROU, AMIKACIN,  in the last 72 hours   Microbiology: No results found for this or any previous visit (from the past 720 hour(s)).  Medical History: Past Medical History  Diagnosis Date  . Back pain   . Asthma     as a child had "episode" of  asthma, used inhaler "once"  . Sleep disorder     pt. reports having sleep study at 46 yrs., old, reports it was normal but he  reports he snores at night    . GERD (gastroesophageal reflux disease)     occas., last episode, 2 months ago  . Obesity     Assessment: 27 yo male with worsening L groin cellulitis.  On clindamycin PTA.  Pharmacy asked to begin empiric vancomycin.  Estimated CrCl > 100 ml/min.  Goal of Therapy:  Vancomycin trough level 10-15 mcg/ml  Plan:  1. Vancomycin 2500 mg IV x 1. 2. Then start vancomycin 1250 mg IV q 12 hrs per obesity nomogram. 3. F/U vancomycin trough level at steady state as indicated. 4. F/U renal function, and cultures.  Tad Moore, BCPS  Clinical Pharmacist Pager [phone]  02/04/2013 7:40 PM

## 2013-02-04 NOTE — ED Provider Notes (Signed)
CSN: [id number]     Arrival date & time 02/04/13  1751 History     First MD Initiated Contact with Patient 02/04/13 1806     Chief Complaint  Patient presents with  . Cellulitis   (Consider location/radiation/quality/duration/timing/severity/associated sxs/prior Treatment) The history is provided by the patient and medical records.   Pt presents to the ED for worsening groin cellulitis.  Pt seen on 8/14 and 8/16 for the same.  Admission was planned on the 8/16 visit but pt declined wishing to try further OP therapy. Pt has been taking bactrim and clindamycin as directed without improvement.  Pt notes this morning he notices the redness spreading to his left thigh.  No extension into the penis or testicles.  Denies any fevers, sweats, or chills.  Past Medical History  Diagnosis Date  . Back pain   . Asthma     as a child had "episode" of  asthma, used inhaler "once"  . Sleep disorder     pt. reports having sleep study at 7 yrs., old, reports it was normal but he  reports he snores at night    . GERD (gastroesophageal reflux disease)     occas., last episode, 2 months ago  . Obesity    Past Surgical History  Procedure Laterality Date  . Tonsillectomy      as a 10 yr. old  . Lumbar laminectomy/decompression microdiscectomy  01/12/2012    Procedure: LUMBAR LAMINECTOMY/DECOMPRESSION MICRODISCECTOMY;  Surgeon: Emilee Hero, MD;  Location: [facility] OR;  Service: Orthopedics;  Laterality: Right;  Right sided lumbar 5-sacrum 1 microdisectomy   History reviewed. No pertinent family history. History  Substance Use Topics  . Smoking status: Former Smoker    Quit date: 12/06/2011  . Smokeless tobacco: Not on file  . Alcohol Use: 7.2 oz/week    12 Cans of beer per week    Review of Systems  Skin: Positive for color change.       cellulitis  All other systems reviewed and are negative.    Allergies  Hydrocodone bitart (antituss)  Home Medications   Current Outpatient Rx    Name  Route  Sig  Dispense  Refill  . acetaminophen (TYLENOL) 500 MG tablet   Oral   Take 1,000 mg by mouth every 6 (six) hours as needed for pain.         . clindamycin (CLEOCIN) 300 MG capsule   Oral   Take 300 mg by mouth 4 (four) times daily. Started 02/02/13, for 7 days, ending 02/08/13.         . ranitidine (ZANTAC) 150 MG tablet   Oral   Take 150 mg by mouth daily as needed for heartburn.          BP 121/86  Pulse 104  Temp(Src) 99.1 F (37.3 C) (Oral)  Resp 16  Ht 5\' 5"  (1.651 m)  Wt 300 lb (136.079 kg)  BMI 49.92 kg/m2  SpO2 100%  Physical Exam  Nursing note and vitals reviewed. Constitutional: He is oriented to person, place, and time. He appears well-developed and well-nourished. No distress.  HENT:  Head: Normocephalic and atraumatic.  Mouth/Throat: Oropharynx is clear and moist.  Eyes: Conjunctivae and EOM are normal. Pupils are equal, round, and reactive to light.  Neck: Normal range of motion. Neck supple.  Cardiovascular: Normal rate, regular rhythm and normal heart sounds.   Pulmonary/Chest: Effort normal and breath sounds normal. No respiratory distress.  Genitourinary: Testes normal and penis normal.  Left groin cellulitisi/panniculitis with induration but no areas of fluctuance; some extension to left thigh but not to penis or testicles  Musculoskeletal: Normal range of motion. He exhibits no edema.  Neurological: He is alert and oriented to person, place, and time.  Skin: Skin is warm and dry. He is not diaphoretic.  Psychiatric: He has a normal mood and affect.    ED Course   Needle decompression Date/Time: 02/04/2013 7:25 PM Performed by: Garlon Hatchet Authorized by: Garlon Hatchet Consent: Verbal consent obtained. Consent given by: patient Patient understanding: patient states understanding of the procedure being performed Patient identity confirmed: verbally with patient Local anesthesia used: yes Anesthesia: local  infiltration Local anesthetic: lidocaine 1% with epinephrine Anesthetic total: 4 ml Comments: Left groin injected with 4mL lidocaine w/epi.  Entered skin with 18G needle without purulent fluid return.  Pt tolerated procedure well, no immediate complications.   (including critical care time)  Labs Reviewed  CBC WITH DIFFERENTIAL - Abnormal; Notable for the following:    WBC 16.6 (*)    Neutrophils Relative % 79 (*)    Neutro Abs 13.1 (*)    Monocytes Absolute 1.1 (*)    All other components within normal limits  BASIC METABOLIC PANEL - Abnormal; Notable for the following:    Potassium 3.3 (*)    Glucose, Bld 141 (*)    All other components within normal limits   No results found. No diagnosis found.  MDM   Labs as above-- leukocytosis at 16.6.  Bedside ultrasound performed by Dr. Anitra Lauth revealing some fluid collection but no definitive abscess formation.  Aspiration attempted, no purulent drainage return.  Pt started on Vanc in the ED.  Consulted unassigned, Dr. Conley Rolls, who has evaluated pt and will admit.  VS stable.  Garlon Hatchet, PA-C 02/04/13 1958

## 2013-02-04 NOTE — ED Notes (Signed)
Pt seen here 2 days ago and diagnosed with cellulitis in L groin, noticed today the redness has started to spread further down his L leg. Taking oral abx as prescribed

## 2013-02-05 ENCOUNTER — Encounter (HOSPITAL_COMMUNITY): Payer: Self-pay | Admitting: Certified Registered"

## 2013-02-05 ENCOUNTER — Encounter (HOSPITAL_COMMUNITY): Admission: EM | Disposition: A | Discharge status: Home Health | Payer: Self-pay | Source: Home / Self Care

## 2013-02-05 ENCOUNTER — Encounter (HOSPITAL_COMMUNITY): Payer: Self-pay | Admitting: General Practice

## 2013-02-05 ENCOUNTER — Inpatient Hospital Stay (HOSPITAL_COMMUNITY): Payer: MEDICAID | Admitting: Certified Registered"

## 2013-02-05 DIAGNOSIS — J45909 Unspecified asthma, uncomplicated: Secondary | ICD-10-CM

## 2013-02-05 HISTORY — PX: GROIN DISSECTION: SHX5250

## 2013-02-05 LAB — CBC
HCT: 37.3 % — ABNORMAL LOW (ref 39.0–52.0)
MCHC: 34.3 g/dL (ref 30.0–36.0)
MCV: 84.9 fL (ref 78.0–100.0)
Platelets: 236 10*3/uL (ref 150–400)
RBC: 4.38 MIL/uL (ref 4.22–5.81)
RDW: 13.6 % (ref 11.5–15.5)
WBC: 12.9 10*3/uL — ABNORMAL HIGH (ref 4.0–10.5)
WBC: 11.6 10*3/uL — ABNORMAL HIGH (ref 4.0–10.5)

## 2013-02-05 LAB — CREATININE, SERUM
Creatinine, Ser: 0.6 mg/dL (ref 0.50–1.35)
GFR calc Af Amer: >90 mL/min (ref >90)

## 2013-02-05 LAB — SURGICAL PCR SCREEN
MRSA, PCR: NEGATIVE
Staphylococcus aureus: NEGATIVE

## 2013-02-05 LAB — BASIC METABOLIC PANEL
Chloride: 101 mEq/L (ref 96–112)
GFR calc Af Amer: >90 mL/min (ref >90)
GFR calc non Af Amer: >90 mL/min (ref >90)
Potassium: 3.4 mEq/L — ABNORMAL LOW (ref 3.5–5.1)
Sodium: 136 mEq/L (ref 135–145)

## 2013-02-05 SURGERY — EXPLORATION, INGUINAL REGION
Anesthesia: General | Site: Groin | Laterality: Left | Wound class: Dirty or Infected

## 2013-02-05 MED ORDER — DIPHENHYDRAMINE HCL 50 MG/ML IJ SOLN
INTRAMUSCULAR | Status: AC
  Administered 2013-02-05: 25 mg
  Filled 2013-02-05: qty 1

## 2013-02-05 MED ORDER — OXYCODONE-ACETAMINOPHEN 5-325 MG PO TABS
1.0000 | ORAL_TABLET | ORAL | Status: DC | PRN
  Administered 2013-02-05 – 2013-02-07 (×5): 2 via ORAL
  Filled 2013-02-05 (×5): qty 2

## 2013-02-05 MED ORDER — PROMETHAZINE HCL 25 MG/ML IJ SOLN: 6.2500 mg | INTRAMUSCULAR | Status: DC | PRN

## 2013-02-05 MED ORDER — MEPERIDINE HCL 25 MG/ML IJ SOLN: 6.2500 mg | INTRAMUSCULAR | Status: DC | PRN

## 2013-02-05 MED ORDER — MIDAZOLAM HCL 5 MG/5ML IJ SOLN
INTRAMUSCULAR | Status: DC | PRN
  Administered 2013-02-05: 1 mg via INTRAVENOUS

## 2013-02-05 MED ORDER — FENTANYL CITRATE 0.05 MG/ML IJ SOLN
INTRAMUSCULAR | Status: DC | PRN
  Administered 2013-02-05 (×3): 50 ug via INTRAVENOUS
  Administered 2013-02-05: 150 ug via INTRAVENOUS
  Administered 2013-02-05: 50 ug via INTRAVENOUS

## 2013-02-05 MED ORDER — LIDOCAINE HCL (CARDIAC) 20 MG/ML IV SOLN
INTRAVENOUS | Status: DC | PRN
  Administered 2013-02-05: 60 mg via INTRAVENOUS

## 2013-02-05 MED ORDER — 0.9 % SODIUM CHLORIDE (POUR BTL) OPTIME
TOPICAL | Status: DC | PRN
  Administered 2013-02-05 (×2): 1000 mL

## 2013-02-05 MED ORDER — PROPOFOL 10 MG/ML IV BOLUS
INTRAVENOUS | Status: DC | PRN
  Administered 2013-02-05: 200 mg via INTRAVENOUS

## 2013-02-05 MED ORDER — HYDROMORPHONE HCL PF 1 MG/ML IJ SOLN
0.5000 mg | INTRAMUSCULAR | Status: DC | PRN
  Administered 2013-02-06 – 2013-02-07 (×4): 1 mg via INTRAVENOUS
  Filled 2013-02-05 (×4): qty 1

## 2013-02-05 MED ORDER — KCL IN DEXTROSE-NACL 20-5-0.45 MEQ/L-%-% IV SOLN
INTRAVENOUS | Status: DC
  Administered 2013-02-05 – 2013-02-08 (×5): via INTRAVENOUS
  Filled 2013-02-05 (×8): qty 1000

## 2013-02-05 MED ORDER — DIPHENHYDRAMINE HCL 50 MG/ML IJ SOLN
25.0000 mg | INTRAMUSCULAR | Status: DC | PRN
  Administered 2013-02-05: 25 mg via INTRAVENOUS

## 2013-02-05 MED ORDER — LACTATED RINGERS IV SOLN
INTRAVENOUS | Status: DC | PRN
  Administered 2013-02-05: 14:00:00 via INTRAVENOUS

## 2013-02-05 MED ORDER — HEPARIN SODIUM (PORCINE) 5000 UNIT/ML IJ SOLN
5000.0000 [IU] | Freq: Three times a day (TID) | INTRAMUSCULAR | Status: DC
  Administered 2013-02-06 – 2013-02-08 (×7): 5000 [IU] via SUBCUTANEOUS
  Filled 2013-02-05 (×9): qty 1

## 2013-02-05 MED ORDER — HYDROMORPHONE HCL PF 1 MG/ML IJ SOLN
0.2500 mg | INTRAMUSCULAR | Status: DC | PRN
  Administered 2013-02-05 (×4): 0.5 mg via INTRAVENOUS

## 2013-02-05 MED ORDER — HYDROMORPHONE HCL PF 1 MG/ML IJ SOLN
INTRAMUSCULAR | Status: AC
  Filled 2013-02-05: qty 2

## 2013-02-05 SURGICAL SUPPLY — 49 items
ADH SKN CLS APL DERMABOND .7 (GAUZE/BANDAGES/DRESSINGS)
BANDAGE GAUZE ELAST BULKY 4 IN (GAUZE/BANDAGES/DRESSINGS) ×1 IMPLANT
BLADE SURG 10 STRL SS (BLADE) ×1 IMPLANT
BLADE SURG 15 STRL LF DISP TIS (BLADE) ×1 IMPLANT
BLADE SURG 15 STRL SS (BLADE) ×2
CHLORAPREP W/TINT 26ML (MISCELLANEOUS) ×2 IMPLANT
CLOTH BEACON ORANGE TIMEOUT ST (SAFETY) ×2 IMPLANT
CONT SPEC 4OZ CLIKSEAL STRL BL (MISCELLANEOUS) ×1 IMPLANT
COVER SURGICAL LIGHT HANDLE (MISCELLANEOUS) ×2 IMPLANT
DECANTER SPIKE VIAL GLASS SM (MISCELLANEOUS) ×1 IMPLANT
DERMABOND ADVANCED (GAUZE/BANDAGES/DRESSINGS)
DERMABOND ADVANCED .7 DNX12 (GAUZE/BANDAGES/DRESSINGS) ×1 IMPLANT
DRAIN PENROSE 1X12 LTX STRL (DRAIN) ×1 IMPLANT
DRAPE LAPAROSCOPIC ABDOMINAL (DRAPES) ×1 IMPLANT
DRAPE PED LAPAROTOMY (DRAPES) ×2 IMPLANT
DRSG PAD ABDOMINAL 8X10 ST (GAUZE/BANDAGES/DRESSINGS) ×1 IMPLANT
ELECT CAUTERY BLADE 6.4 (BLADE) ×2 IMPLANT
ELECT REM PT RETURN 9FT ADLT (ELECTROSURGICAL) ×2
ELECTRODE REM PT RTRN 9FT ADLT (ELECTROSURGICAL) ×1 IMPLANT
GAUZE SPONGE 4X4 16PLY XRAY LF (GAUZE/BANDAGES/DRESSINGS) ×1 IMPLANT
GLOVE BIO SURGEON STRL SZ7 (GLOVE) ×3 IMPLANT
GLOVE BIOGEL PI IND STRL 6.5 (GLOVE) IMPLANT
GLOVE BIOGEL PI IND STRL 7.5 (GLOVE) ×1 IMPLANT
GLOVE BIOGEL PI INDICATOR 6.5 (GLOVE) ×1
GLOVE BIOGEL PI INDICATOR 7.5 (GLOVE) ×1
GLOVE ECLIPSE 6.5 STRL STRAW (GLOVE) ×1 IMPLANT
GOWN STRL NON-REIN LRG LVL3 (GOWN DISPOSABLE) ×4 IMPLANT
HOVERMATT SINGLE USE (MISCELLANEOUS) ×1 IMPLANT
KIT BASIN OR (CUSTOM PROCEDURE TRAY) ×2 IMPLANT
KIT ROOM TURNOVER OR (KITS) ×2 IMPLANT
NDL HYPO 25GX1X1/2 BEV (NEEDLE) ×1 IMPLANT
NEEDLE HYPO 25GX1X1/2 BEV (NEEDLE) ×2 IMPLANT
NS IRRIG 1000ML POUR BTL (IV SOLUTION) ×2 IMPLANT
PACK SURGICAL SETUP 50X90 (CUSTOM PROCEDURE TRAY) ×2 IMPLANT
PAD ARMBOARD 7.5X6 YLW CONV (MISCELLANEOUS) ×2 IMPLANT
PENCIL BUTTON HOLSTER BLD 10FT (ELECTRODE) ×2 IMPLANT
SPONGE LAP 18X18 X RAY DECT (DISPOSABLE) ×1 IMPLANT
SUT ETHILON 2 0 FS 18 (SUTURE) ×1 IMPLANT
SUT MNCRL AB 4-0 PS2 18 (SUTURE) ×1 IMPLANT
SUT VIC AB 3-0 SH 27 (SUTURE)
SUT VIC AB 3-0 SH 27XBRD (SUTURE) ×1 IMPLANT
SWAB COLLECTION DEVICE MRSA (MISCELLANEOUS) ×1 IMPLANT
SYR BULB IRRIGATION 50ML (SYRINGE) ×1 IMPLANT
SYR CONTROL 10ML LL (SYRINGE) ×2 IMPLANT
TOWEL OR 17X24 6PK STRL BLUE (TOWEL DISPOSABLE) ×2 IMPLANT
TOWEL OR 17X26 10 PK STRL BLUE (TOWEL DISPOSABLE) ×2 IMPLANT
TUBE ANAEROBIC SPECIMEN COL (MISCELLANEOUS) ×1 IMPLANT
TUBE CONNECTING 12X1/4 (SUCTIONS) ×1 IMPLANT
YANKAUER SUCT BULB TIP NO VENT (SUCTIONS) ×1 IMPLANT

## 2013-02-05 NOTE — Anesthesia Postprocedure Evaluation (Signed)
  Anesthesia Post-op Note  Patient: Todd Velez  Procedure(s) Performed: Procedure(s): Debridement of cellulitis/abscess of  thigh/suprapubic region (Left)  Patient Location: PACU  Anesthesia Type:General  Level of Consciousness: awake, alert , oriented and patient cooperative  Airway and Oxygen Therapy: Patient Spontanous Breathing  Post-op Pain: mild  Post-op Assessment: Post-op Vital signs reviewed, Patient's Cardiovascular Status Stable, Respiratory Function Stable, Patent Airway, No signs of Nausea or vomiting and Pain level controlled  Post-op Vital Signs: Reviewed and stable  Complications: No apparent anesthesia complications

## 2013-02-05 NOTE — Anesthesia Preprocedure Evaluation (Addendum)
Anesthesia Evaluation  Patient identified by MRN, date of birth, ID band Patient awake    Reviewed: Allergy & Precautions, H&P , NPO status , Patient's Chart, lab work & pertinent test results, reviewed documented beta blocker date and time   History of Anesthesia Complications Negative for: history of anesthetic complications  Airway Mallampati: II TM Distance: >3 FB Neck ROM: Full    Dental  (+) Teeth Intact and Dental Advisory Given   Pulmonary asthma ,  Asthma as a child. No meds needed per patient. Pt denies having sleep apnea. breath sounds clear to auscultation        Cardiovascular negative cardio ROS  Rhythm:Regular Rate:Normal     Neuro/Psych negative psych ROS   GI/Hepatic Neg liver ROS, GERD-  Controlled,  Endo/Other  Morbid obesity  Renal/GU negative Renal ROS     Musculoskeletal negative musculoskeletal ROS (+)   Abdominal (+) + obese,  Abdomen: soft. Bowel sounds: normal.  Peds  Hematology negative hematology ROS (+)   Anesthesia Other Findings   Reproductive/Obstetrics                         Anesthesia Physical  Anesthesia Plan  ASA: III  Anesthesia Plan: General   Post-op Pain Management:    Induction: Intravenous  Airway Management Planned: Oral ETT  Additional Equipment:   Intra-op Plan:   Post-operative Plan: Extubation in OR  Informed Consent: I have reviewed the patients History and Physical, chart, labs and discussed the procedure including the risks, benefits and alternatives for the proposed anesthesia with the patient or authorized representative who has indicated his/her understanding and acceptance.   Dental advisory given  Plan Discussed with: CRNA  Anesthesia Plan Comments:         Anesthesia Quick Evaluation

## 2013-02-05 NOTE — Progress Notes (Signed)
[facility] PROGRESS NOTE  Riese Hellard ZOX:[id number] DOB: 1986-02-05 DOA: 02/04/2013 PCP: No PCP Per Patient  Assessment/Plan: Principal Problem:   Abscess and cellulitis Active Problems:   Asthma   Obesity    1. Left groin abscess: Patient presented with progressive redness and swelling, of left groin, despite outpatient antibiotic therapy.  WCC was16.6K at presentation, and patient was afebrile. Commenced on iv Vancomycin/Clindamycin, now day #2. Managing with analgesics. Dr Gaynelle Adu provided surgical consultation, and surgical I&D is scheduled for today. .  2. Query Asthma: Per patient, he had a single episode of bonchospasm in childhood. None since. Suspect an episode of acute bronchitis at that time.   3. Morbid Obesity/Possible Sleep disorder: Patient's body habitus and snoring, are suggestive of OSA. However, patient has never been evaluated by formal PSG.   Code Status: Full Code. Family Communication:  Disposition Plan: to be determined. Dr Tsuei/surgical team has kindly offered to take over care of this patient. Transferred to surgical service, accordingly.    Brief narrative: 27 year old morbidly obese male, with history of childhood bronchial asthma, sleep disorder, GERD, chronic back pain, s/p lumbar laminectomy/decompression microdiskectomy 12/2011, presenting to the emergency department for followup exam, because of worsening left groin cellulitis and discomfort. The patient states that he developed what appeared to be an infected ingrown hair in his left groin about one week ago. There was a small amount of surrounding redness at that time. He presented to the emergency department on 01/31/13, was given a dose of IV antibiotics and prescription for oral Bactrim and discharged. The erythema worsened and he returned on the 02/02/13. It was recommended that he be admitted for IV antibiotics, but patient declined and wanted to continue with outpatient management. He did  receive another dose of IV antibiotics in the emergency room at that time. His oral antibiotic coverage was broadened with Clindamycin as well. He came back on 02/04/13, because of worsening redness, persistent discomfort, subjective fevers and chills. He denies any trauma to the area, or drainage. Evaluation in the ER included a leukocytosis with WBC of 16.6K, K of 3.3, normal renal fx tests. Attempted aspiration in the ER didn't yield any purulent aspirate. Admitted for further management.    Consultants:  Dr Gaynelle Adu, surgeon.   Procedures:  N/A.   Antibiotics:  Clindamycin 02/04/13>>>  Vancomycin 02/04/13>>>  HPI/Subjective: No new issues.   Objective: Vital signs in last 24 hours: Temp:  [97.9 F (36.6 C)-99.1 F (37.3 C)] 97.9 F (36.6 C) (08/19 0547) Pulse Rate:  [74-104] 74 (08/19 0547) Resp:  [16-18] 18 (08/19 0547) BP: (111-130)/(46-86) 111/52 mmHg (08/19 0547) SpO2:  [98 %-100 %] 98 % (08/19 0547) Weight:  [136.079 kg (300 lb)] 136.079 kg (300 lb) (08/18 2302) Weight change:  Last BM Date: 02/04/13  Intake/Output from previous day: 08/18 0701 - 08/19 0700 In: 571.7 [I.V.:521.7; IV Piggyback:50] Out: -      Physical Exam: General: Comfortable, alert, communicative, fully oriented, not short of breath at rest.  HEENT:  No clinical pallor, no jaundice, no conjunctival injection or discharge. Hydration is satisfactory.  NECK:  Supple, JVP not seen, no carotid bruits, no palpable lymphadenopathy, no palpable goiter. CHEST:  Clinically clear to auscultation, no wheezes, no crackles. HEART:  Sounds 1 and 2 heard, normal, regular, no murmurs. ABDOMEN:  Morbidly obese, soft, non-tender, no palpable organomegaly, no palpable masses, normal bowel sounds. GENITALIA:  Not examined. Patient has marked induration, erythema and tenderness, surrounding a pointing area in the left  groin. Below the inguinal ligament, there is erythema, but no induration. LOWER EXTREMITIES:   No pitting edema, palpable peripheral pulses. MUSCULOSKELETAL SYSTEM: Unremarkable. CENTRAL NERVOUS SYSTEM:  No focal neurologic deficit on gross examination.  Lab Results:  Recent Labs  02/04/13 1837  WBC 16.6*  HGB 14.1  HCT 39.9  PLT 236    Recent Labs  02/04/13 1837  NA 135  K 3.3*  CL 97  CO2 28  GLUCOSE 141*  BUN 8  CREATININE 0.57  CALCIUM 9.0   No results found for this or any previous visit (from the past 240 hour(s)).   Studies/Results: No results found.  Medications: Scheduled Meds: . clindamycin (CLEOCIN) IV  600 mg Intravenous Q6H  . docusate sodium  100 mg Oral BID  . famotidine  20 mg Oral QHS  . heparin  5,000 Units Subcutaneous Q8H  . potassium chloride  40 mEq Oral Daily  . vancomycin  1,250 mg Intravenous Q12H   Continuous Infusions: . dextrose 5 % and 0.9% NaCl 100 mL/hr at 02/04/13 2347   PRN Meds:.acetaminophen, HYDROmorphone (DILAUDID) injection, ondansetron (ZOFRAN) IV, ondansetron    LOS: 1 day   Gladine Plude,CHRISTOPHER  [facility] Pager [phone]. If 8PM-8AM, please contact night-coverage at [email or web address], password [facility] 02/05/2013, 7:26 AM  LOS: 1 day

## 2013-02-05 NOTE — Progress Notes (Signed)
  Subjective:  "I'm starving" Still with left groin pain  Objective: Vital signs in last 24 hours: Temp:  [97.9 F (36.6 C)-99.1 F (37.3 C)] 97.9 F (36.6 C) (08/19 0547) Pulse Rate:  [74-104] 74 (08/19 0547) Resp:  [16-18] 18 (08/19 0547) BP: (111-130)/(46-86) 111/52 mmHg (08/19 0547) SpO2:  [98 %-100 %] 98 % (08/19 0547) Weight:  [300 lb (136.079 kg)] 300 lb (136.079 kg) (08/18 2302) Last BM Date: 02/04/13  Intake/Output from previous day: 08/18 0701 - 08/19 0700 In: 571.7 [I.V.:521.7; IV Piggyback:50] Out: -  Intake/Output this shift:    Thick induration/ erythema of left groin/ suprapubic region Mild soft pink erythema of the left upper thigh  Lab Results:   Recent Labs  02/04/13 1837  WBC 16.6*  HGB 14.1  HCT 39.9  PLT 236   BMET  Recent Labs  02/04/13 1837  NA 135  K 3.3*  CL 97  CO2 28  GLUCOSE 141*  BUN 8  CREATININE 0.57  CALCIUM 9.0   PT/INR No results found for this basename: LABPROT, INR,  in the last 72 hours ABG No results found for this basename: PHART, PCO2, PO2, HCO3,  in the last 72 hours  Studies/Results: No results found.  Anti-infectives: Anti-infectives   Start     Dose/Rate Route Frequency Ordered Stop   02/05/13 0800  vancomycin (VANCOCIN) 1,250 mg in sodium chloride 0.9 % 250 mL IVPB     1,250 mg 166.7 mL/hr over 90 Minutes Intravenous Every 12 hours 02/04/13 1941     02/05/13 0000  clindamycin (CLEOCIN) IVPB 600 mg     600 mg 100 mL/hr over 30 Minutes Intravenous 4 times per day 02/04/13 2330     02/04/13 2000  vancomycin (VANCOCIN) 2,500 mg in sodium chloride 0.9 % 500 mL IVPB     2,500 mg 250 mL/hr over 120 Minutes Intravenous  Once 02/04/13 1941 02/04/13 2204      Assessment/Plan: s/p Procedure(s): Debridement of cellulitis/abscess of  thigh/suprapubic region (N/A) To OR today for debridement of left suprapubic region. Transfer to [facility] - we will consult [facility] for medical assistance if needed. The surgical  procedure has been discussed with the patient.  Potential risks, benefits, alternative treatments, and expected outcomes have been explained.  All of the patient's questions at this time have been answered.  The likelihood of reaching the patient's treatment goal is good.  The patient understand the proposed surgical procedure and wishes to proceed.   02/05/2013 8:18 AM   LOS: 1 day    Todd Velez K. 02/05/2013

## 2013-02-05 NOTE — Anesthesia Procedure Notes (Signed)
Procedure Name: LMA Insertion Date/Time: 02/05/2013 2:18 PM Performed by: Ellin Goodie Pre-anesthesia Checklist: Patient identified, Emergency Drugs available, Suction available, Patient being monitored and Timeout performed Patient Re-evaluated:Patient Re-evaluated prior to inductionOxygen Delivery Method: Circle system utilized Preoxygenation: Pre-oxygenation with 100% oxygen Intubation Type: IV induction Ventilation: Mask ventilation without difficulty LMA: LMA with gastric port inserted LMA Size: 5.0 Number of attempts: 1 Placement Confirmation: positive ETCO2 and breath sounds checked- equal and bilateral Tube secured with: Tape

## 2013-02-05 NOTE — Preoperative (Signed)
Beta Blockers   Reason not to administer Beta Blockers:Not Applicable 

## 2013-02-05 NOTE — Op Note (Signed)
Pre-op Diagnosis:  Cellulitis and abscess of left groin and upper thigh Post-op Diagnosis:  Same Procedure:  Incision and debridement of skin and subcutaneous tissue - left groin Surgeon:  Even Budlong K. Anesthesia: Gen. Endotracheal Indications: This is a 27 Year old male with morbid obesity who presents with a one-week history of worsening infection in his left groin. This apparently began at an ingrown hair. He has been the worst part of several times over the week and has had several courses of IV and by mouth antibiotics. The infection continues to worsen and has spread down into his upper thigh. He was admitted to the hospital and started on intravenous antibiotics.He presents now for an incision and debridement.  Description of procedure:  The patient was brought to the operating room and placed in the supine position on the operating room table. After an adequate level of general anesthesia was obtained, the patient's legs were placed in lithotomy position in stirrups. His left groin and upper thigh and perineum were prepped with Betadine and draped in sterile fashion. A timeout was taken to ensure the proper patient proper procedure. I made a small incision over the area of the thickest fluctuance. We entered a large cavity containing some foul-smelling purulent fluid. This was sent for culture. We suctioned out about 50 cc of pus. We then explored the wound. This tract further down into the groin as well as up towards the abdominal wall. We opened the wound widely for a distance of about 12 cm. We irrigated the wound thoroughly with sterile saline. We cauterized for hemostasis. I placed a Penrose drain superiorly as well as inferiorly and secured this with 2-0 nylon sutures. The wound is then packed with saline moistened Kerlix. A dry dressing was applied. The patient was then extubated and brought to the recovery room in stable condition. All sponge, instrument, and needle counts are  correct.  Wilmon Arms. Corliss Skains, MD, [facility] Surgery  General/ Trauma Surgery  02/05/2013 3:15 PM

## 2013-02-05 NOTE — Transfer of Care (Signed)
Immediate Anesthesia Transfer of Care Note  Patient: [facility]  Procedure(s) Performed: Procedure(s): Debridement of cellulitis/abscess of  thigh/suprapubic region (Left)  Patient Location: PACU  Anesthesia Type:General  Level of Consciousness: awake, alert  and oriented  Airway & Oxygen Therapy: Patient connected to face mask oxygen  Post-op Assessment: Report given to PACU RN  Post vital signs: stable  Complications: No apparent anesthesia complications

## 2013-02-05 NOTE — ED Provider Notes (Signed)
Medical screening examination/treatment/procedure(s) were conducted as a shared visit with non-physician practitioner(s) and myself.  I personally evaluated the patient during the encounter Patient returning with worsening cellulitis and pain in the groin area. He has been taking clindamycin without improvement. Focal area concerning for possible abscess however ultrasound is not conclusive and when inserting a needle no pus returned. Patient will be admitted for worsening cellulitis and surgical consult  Gwyneth Sprout, MD 02/05/13 [phone]

## 2013-02-06 ENCOUNTER — Encounter (HOSPITAL_COMMUNITY): Payer: Self-pay | Admitting: Surgery

## 2013-02-06 LAB — BASIC METABOLIC PANEL
Chloride: 103 mEq/L (ref 96–112)
GFR calc Af Amer: >90 mL/min (ref >90)
Potassium: 3.8 mEq/L (ref 3.5–5.1)
Sodium: 138 mEq/L (ref 135–145)

## 2013-02-06 LAB — CBC
HCT: 36.6 % — ABNORMAL LOW (ref 39.0–52.0)
Platelets: 238 10*3/uL (ref 150–400)
RDW: 13.7 % (ref 11.5–15.5)
WBC: 10.9 10*3/uL — ABNORMAL HIGH (ref 4.0–10.5)

## 2013-02-06 LAB — GLUCOSE, CAPILLARY: Glucose-Capillary: 114 mg/dL — ABNORMAL HIGH (ref 70–99)

## 2013-02-06 NOTE — Care Management Note (Signed)
  Page 1 of 1   02/06/2013     2:28:22 PM   CARE MANAGEMENT NOTE 02/06/2013  Patient:  Todd Velez, Todd Velez   Account Number:  [id number]  Date Initiated:  02/06/2013  Documentation initiated by:  Ronny Flurry  Subjective/Objective Assessment:     Action/Plan:   Anticipated DC Date:     Anticipated DC Plan:  HOME W HOME HEALTH SERVICES         Choice offered to / List presented to:  [name]        HH arranged  HH-1 RN      [facility] agency  [facility]   Status of service:   Medicare Important Message given?   (If response is "NO", the following Medicare IM given date fields will be blank) Date Medicare IM given:   Date Additional Medicare IM given:    Discharge Disposition:    Per UR Regulation:    If discussed at Long Length of Stay Meetings, dates discussed:    Comments:   02-06-13  Spoke with patient regarding home health RN , patient in agreement with home health and feels he can learn to do dressing change himself .  Patient's mailing address is [address] , [place] [address] ( Uncles home )  Patient lives at [address] , phone [phone]   Todd Velez [phone]

## 2013-02-06 NOTE — Plan of Care (Signed)
Problem: Undesirable Food Choices (NB-1.7) Goal: Nutrition education Formal process to instruct or train a patient/client in a skill or to impart knowledge to help patients/clients voluntarily manage or modify food choices and eating behavior to maintain or improve health. Outcome: Completed/Met Date Met:  02/06/13  RD consulted for nutrition education regarding diabetes/weight loss.  Body mass index is 49.92 kg/(m^2). Pt meets criteria for Obesity Class III based on current BMI.  RD provided "Weight Loss Tips" & "Carbohydrate Counting for People With Diabetes" handouts from the Academy of Nutrition and Dietetics.  Per diet recall, patient drinks mostly flavored water, however, he does admit to drinking regular sodas.  He also skips breakfast and eats out for lunch daily "where-ever everyone else goes". RD encouraged him to consider bringing his own lunch to work. Emphasized consumption of calorie-free beverages and limiting sodas. Patient currently exercises at [employer].  Expect fair compliance.  Current diet order is Regular, patient is consuming approximately 100% of meals at this time. Labs and medications reviewed. No further nutrition interventions warranted at this time. If additional nutrition issues arise, please re-consult RD.  Recommend changing his current diet order to Carbohydrate Modified High Calorie (he is currently getting regular soda on meal trays).  Maureen Chatters, RD, LDN Pager #: [phone] After-Hours Pager #: [phone]

## 2013-02-06 NOTE — Progress Notes (Signed)
Still sore, but feels better than prior to surgery. Will explore possible home health nursing to assist with dressing changes. Continue abx  Wilmon Arms. Corliss Skains, MD, [facility]  General/ Trauma Surgery  02/06/2013 2:22 PM

## 2013-02-06 NOTE — Clinical Documentation Improvement (Signed)
THIS DOCUMENT IS NOT A PERMANENT PART OF THE MEDICAL RECORD  Please update your documentation with the medical record to reflect your response to this query. If you need help knowing how to do this please call [phone].  02/06/13   Dr. Corliss Skains,  In a better effort to capture your patient's severity of illness, reflect appropriate length of stay and utilization of resources, a review of the patient medical record has revealed the following information:   - Admitted for Cellulitis and Abscess of left groin and upper thigh   - WBC on admission  16.6   - Pulse Rate on admission  104   - 50 cc of pus suctioned from wound per Operative Note   - Failed Outpatient Management x 2 for same complaint prior to this admission   - Pre and Post Operative Antibiotics given.   -Postoperative Wound Care requiring packing, etc.   Based on your clinical judgment, please document in the progress notes and discharge summary if a condition below provides greater specificity regarding the patient's condition:   - Systemic Inflammatory Response Syndrome (SIRS) 2/2 Left Groin infection   - Leukocytosis and Tachycardia 2/2 Cellulits and Abscess   - Other Condition   - Unable to Clinically Determine    In responding to this query please exercise your independent judgment.    The fact that a query is asked, does not imply that any particular answer is desired or expected.   You may use Possible, Probable, or Suspected with inpatient documentation.   Possible, Probable, and Suspected diagnoses MUST be documented as such at the time of discharge, unless otherwise confirmed or ruled out.    Thank You,  Jerral Ralph  RN BSN CCDS Certified Clinical Documentation Specialist: [phone] Health Information Management [place]  Reviewed:  no additional documentation provided

## 2013-02-06 NOTE — Clinical Documentation Improvement (Signed)
THIS DOCUMENT IS NOT A PERMANENT PART OF THE MEDICAL RECORD  Please update your documentation with the medical record to reflect your response to this query. If you need help knowing how to do this please call [phone].  02/06/13   Dr. Corliss Skains,  In a better effort to capture your patient's severity of illness, reflect appropriate length of stay and utilization of resources, a review of the patient medical record has revealed the following information:   - Admitted for Cellulitis and Abscess of left groin and upper thigh   - WBC on admission  16.6   - Pulse Rate on admission  104   - 50 cc of pus suctioned from wound per Operative Note   - Failed Outpatient Management x 2 for same complaint prior to this admission   - Pre and Post Operative Antibiotics given.   -Postoperative Wound Care requiring packing, etc.   Based on your clinical judgment, please document in the progress notes and discharge summary if a condition below provides greater specificity regarding the patient's condition:   - Systemic Inflammatory Response Syndrome (SIRS) 2/2 Left Groin infection   - Leukocytosis and Tachycardia 2/2 Cellulits and Abscess   - Other Condition   - Unable to Clinically Determine    In responding to this query please exercise your independent judgment.    The fact that a query is asked, does not imply that any particular answer is desired or expected.   You may use Possible, Probable, or Suspected with inpatient documentation.   Possible, Probable, and Suspected diagnoses MUST be documented as such at the time of discharge, unless otherwise confirmed or ruled out.   Reviewed: 02/07/13 - duplicate query entered by mistake.  Mathis Dad RN  Thank You,  Jerral Ralph  RN BSN CCDS Certified Clinical Documentation Specialist: [phone] Health Information Management [place]

## 2013-02-06 NOTE — Progress Notes (Addendum)
1 Day Post-Op  Subjective: Doing well, left groin is very sensitive and draining some fluid even today.  Objective: Vital signs in last 24 hours: Temp:  [96.8 F (36 C)-98.6 F (37 C)] 98.3 F (36.8 C) (08/20 0523) Pulse Rate:  [64-86] 65 (08/20 0523) Resp:  [11-25] 20 (08/20 0523) BP: (109-156)/(35-71) 131/55 mmHg (08/20 0523) SpO2:  [94 %-98 %] 97 % (08/20 0523) Last BM Date: 02/03/13 Diet: regular Afebrile, VSS WBC better, BMP is normal. Intake/Output from previous day: 08/19 0701 - 08/20 0700 In: 2032 [I.V.:2032] Out: -  Intake/Output this shift:    General appearance: alert, cooperative and no distress GI: soft, non-tender; bowel sounds normal; no masses,  no organomegaly Skin: Open wound is 5-6 cm deep  x 8 cm wide, with penrose drains in tracts at both sides of the wound, left and right.  It is red, still draining some clear-bloody fluid.  Lab Results:   Recent Labs  02/05/13 2012 02/06/13 0702  WBC 11.6* 10.9*  HGB 12.8* 12.3*  HCT 37.2* 36.6*  PLT 236 238    BMET  Recent Labs  02/05/13 0847 02/05/13 2012 02/06/13 0702  NA 136  --  138  K 3.4*  --  3.8  CL 101  --  103  CO2 26  --  28  GLUCOSE 101*  --  104*  BUN 10  --  7  CREATININE 0.64 0.60 0.68  CALCIUM 8.6  --  8.6   PT/INR No results found for this basename: LABPROT, INR,  in the last 72 hours  No results found for this basename: AST, ALT, ALKPHOS, BILITOT, PROT, ALBUMIN,  in the last 168 hours   Lipase     Component Value Date/Time   LIPASE 17 08/23/2012 0012     Studies/Results: No results found.  Medications: . clindamycin (CLEOCIN) IV  600 mg Intravenous Q6H  . famotidine  20 mg Oral QHS  . heparin  5,000 Units Subcutaneous Q8H  . vancomycin  1,250 mg Intravenous Q12H    Assessment/Plan Cellulitis and abscess of left groin and upper thigh S/p:  Incision and debridement of skin and subcutaneous tissue - left groin, 02/05/2013, Wilmon Arms. Tsuei, MD. Body mass index is  49.92 kg/(m^2). Hx of back pain GERD Possible sleep disorder.  Plan:  BID dressing changes, continue antibiotics, culture pending.  Ask case management to see if Home health is an option.  Ask dietician to see, both parents are diabetics.      LOS: 2 days    Cardin Nitschke 02/06/2013

## 2013-02-07 LAB — BASIC METABOLIC PANEL
Calcium: 8.8 mg/dL (ref 8.4–10.5)
Creatinine, Ser: 0.66 mg/dL (ref 0.50–1.35)
GFR calc Af Amer: >90 mL/min (ref >90)

## 2013-02-07 LAB — GLUCOSE, CAPILLARY
Glucose-Capillary: 93 mg/dL (ref 70–99)
Glucose-Capillary: 94 mg/dL (ref 70–99)

## 2013-02-07 MED ORDER — VANCOMYCIN HCL 10 G IV SOLR
1500.0000 mg | Freq: Three times a day (TID) | INTRAVENOUS | Status: DC
  Administered 2013-02-07 – 2013-02-08 (×3): 1500 mg via INTRAVENOUS
  Filled 2013-02-07 (×4): qty 1500

## 2013-02-07 MED ORDER — OXYCODONE HCL 5 MG PO TABS
5.0000 mg | ORAL_TABLET | ORAL | Status: DC | PRN
  Administered 2013-02-07 – 2013-02-08 (×2): 15 mg via ORAL
  Filled 2013-02-07 (×2): qty 3

## 2013-02-07 NOTE — Progress Notes (Signed)
Wilmon Arms. Corliss Skains, MD, [facility] Surgery  General/ Trauma Surgery  02/07/2013 10:07 AM

## 2013-02-07 NOTE — Clinical Documentation Improvement (Signed)
THIS DOCUMENT IS NOT A PERMANENT PART OF THE MEDICAL RECORD  Please update your documentation with the medical record to reflect your response to this query. If you need help knowing how to do this please call [phone].  02/07/13   Dr. Corliss Skains,  In a better effort to capture your patient's severity of illness, reflect appropriate length of stay and utilization of resources, a review of the patient medical record has revealed the following information:  "Description of procedure: The patient was brought to the operating room and placed in the supine position on the operating room table. After an adequate level of general anesthesia was obtained, the patient's legs were placed in lithotomy position in stirrups. His left groin and upper thigh and perineum were prepped with Betadine and draped in sterile fashion. A timeout was taken to ensure the proper patient proper procedure. I made a small incision over the area of the thickest fluctuance. We entered a large cavity containing some foul-smelling purulent fluid. This was sent for culture. We suctioned out about 50 cc of pus. We then explored the wound. This tract further down into the groin as well as up towards the abdominal wall. We opened the wound widely for a distance of about 12 cm. We irrigated the wound thoroughly with sterile saline. We cauterized for hemostasis. I placed a Penrose drain superiorly as well as inferiorly and secured this with 2-0 nylon sutures. The wound is then packed with saline moistened Kerlix. A dry dressing was applied. The patient was then extubated and brought to the recovery room in stable condition. All sponge, instrument, and needle counts are correct." Op Note signed by Wilmon Arms. Tsuei, MD at 02/05/2013 3:15 PM   The title of the performed surgical procedure is "Incision and Debridement of Skin and Subcutaneous Tissue", please clarify, based on your clinical judgment, if any additional type of debridement was  performed during this procedure:           Excisional Debridement-  Cutting away necrotic, devitalized tissue or slough to  Level of viable tissue using a sharp instrument (example, scalpel, scissors, etc).           NON Excisional Debridement-  The removal of necrotic, devitalized tissue or slough by means of scraping, mechanical brushing, flushing, or washing. (example: irrigation, whirlpool);  Minor removal of loose fragments   In responding to this query please exercise your independent judgment.  The fact that a query is asked, does not imply that any particular answer is desired or expected.   Reviewed: 02/06/13 - telephone call to Dr. Harlon Flor.  No additional type of debridement performed.  Mathis Dad RN  (query completed 02/06/13 but not entered into [facility] until 02/07/13.)  Thank You,  Jerral Ralph  RN BSN CCDS Certified Clinical Documentation Specialist: [phone] Health Information Management [place]

## 2013-02-07 NOTE — Progress Notes (Signed)
ANTIBIOTIC CONSULT NOTE - Follow-Up  Pharmacy Consult for Vancomycin Indication: Staph aureus L groin abscess  Allergies  Allergen Reactions  . Hydrocodone Bitart (Antituss) [Hydrocodone] Itching  . Hydrocodone-Acetaminophen Itching    Patient Measurements: Height: 5\' 5"  (165.1 cm) Weight: 300 lb (136.079 kg) IBW/kg (Calculated) : 61.5  Vital Signs: Temp: 98.1 F (36.7 C) (08/21 0619) Temp src: Oral (08/21 0619) BP: 118/54 mmHg (08/21 0619) Pulse Rate: 61 (08/21 0619) Intake/Output from previous day: 08/20 0701 - 08/21 0700 In: 3048.3 [P.O.:1060; I.V.:938.3; IV Piggyback:1050] Out: 1000 [Urine:1000] Intake/Output from this shift:    Labs:  Recent Labs  02/05/13 0847 02/05/13 2012 02/06/13 0702  WBC 12.9* 11.6* 10.9*  HGB 12.8* 12.8* 12.3*  PLT 222 236 238  CREATININE 0.64 0.60 0.68   Estimated Creatinine Clearance: 180.7 ml/min (by C-G formula based on Cr of 0.68).  Recent Labs  02/07/13 0713  VANCOTROUGH <5.0*     Microbiology: Recent Results (from the past 720 hour(s))  SURGICAL PCR SCREEN     Status: None   Collection Time    02/05/13 10:51 AM      Result Value Range Status   MRSA, PCR NEGATIVE  NEGATIVE Final   Staphylococcus aureus NEGATIVE  NEGATIVE Final   Comment:            The Xpert SA Assay (FDA     approved for NASAL specimens     in patients over 57 years of age),     is one component of     a comprehensive surveillance     program.  Test performance has     been validated by [employer] for patients greater     than or equal to 21 year old.     It is not intended     to diagnose infection nor to     guide or monitor treatment.  CULTURE, ROUTINE-ABSCESS     Status: None   Collection Time    02/05/13  2:39 PM      Result Value Range Status   Specimen Description ABSCESS LEFT GROIN   Final   Special Requests PATIENT ON VANCOMYCIN AND CLINDAMYCIN   Final   Gram Stain     Final   Value: FEW WBC PRESENT, PREDOMINANTLY PMN   NO SQUAMOUS EPITHELIAL CELLS SEEN     FEW GRAM POSITIVE COCCI     IN PAIRS IN CLUSTERS     Performed at [facility]   Culture     Final   Value: ABUNDANT STAPHYLOCOCCUS AUREUS     Note: RIFAMPIN AND GENTAMICIN SHOULD NOT BE USED AS SINGLE DRUGS FOR TREATMENT OF STAPH INFECTIONS.     Performed at [facility]   Report Status PENDING   Incomplete  ANAEROBIC CULTURE     Status: None   Collection Time    02/05/13  2:39 PM      Result Value Range Status   Specimen Description ABSCESS LEFT GROIN   Final   Special Requests NONE PATIENT ON VANCOMYCIN AND CLINDAMYCIN   Final   Gram Stain     Final   Value: FEW WBC PRESENT, PREDOMINANTLY PMN     NO SQUAMOUS EPITHELIAL CELLS SEEN     FEW GRAM POSITIVE COCCI     IN PAIRS IN CLUSTERS     Performed at [facility]   Culture     Final   Value: NO ANAEROBES ISOLATED; CULTURE IN PROGRESS FOR 5  DAYS     Performed at [facility]   Report Status PENDING   Incomplete    Assessment: Pt on Vancomycin and Clindamycin D#4 for SA L groin abscess. on po clindamycin PTA since 8/16. s/p I&D 8/19. Afeb. Wbc down to 10.9.  Vancomycin Trough <5 mcg/ml (subtherapeutic) on 1250mg  IV q12h  8/18 Vanc>> 8/18 Clinda>>  8/19 L groin abscess>>abundant SA  Goal of Therapy:  Vancomycin trough level 10-15 mcg/ml  Plan:  1. Change Vancomycin to 1.5gm IV q8h. 2. Will check trough at new Css if pt continues 3. Will f/u microbiological data, renal function, and pt's clinical condition  Christoper Fabian, PharmD, BCPS Clinical pharmacist, pager [phone]  02/07/2013 11:09 AM

## 2013-02-07 NOTE — Progress Notes (Signed)
2 Days Post-Op  Subjective: Still very sore.  Dressing change is very painful.  I will get him something for pain and then do dressing change.    Objective: Vital signs in last 24 hours: Temp:  [98.1 F (36.7 C)-98.5 F (36.9 C)] 98.1 F (36.7 C) (08/21 0619) Pulse Rate:  [61-76] 61 (08/21 0619) Resp:  [18-23] 18 (08/21 0619) BP: (98-128)/(33-65) 118/54 mmHg (08/21 0619) SpO2:  [98 %] 98 % (08/21 0619) Last BM Date: 02/03/13 Diet: carb Modified Afebrile, BSS No labs Intake/Output from previous day: 08/20 0701 - 08/21 0700 In: 3048.3 [P.O.:1060; I.V.:938.3; IV Piggyback:1050] Out: 1000 [Urine:1000] Intake/Output this shift:    General appearance: alert, cooperative and no distress Skin: Open area is painful, less drainage.  Penrose drains are intact.  Side of the wound are clean and pink.  He has some skin loss around the edges of the wound.  Depth of the wound is 5-6 cm depending on where you measure and about 8 cm wide  Lab Results:   Recent Labs  02/05/13 2012 02/06/13 0702  WBC 11.6* 10.9*  HGB 12.8* 12.3*  HCT 37.2* 36.6*  PLT 236 238    BMET  Recent Labs  02/05/13 0847 02/05/13 2012 02/06/13 0702  NA 136  --  138  K 3.4*  --  3.8  CL 101  --  103  CO2 26  --  28  GLUCOSE 101*  --  104*  BUN 10  --  7  CREATININE 0.64 0.60 0.68  CALCIUM 8.6  --  8.6   PT/INR No results found for this basename: LABPROT, INR,  in the last 72 hours  No results found for this basename: AST, ALT, ALKPHOS, BILITOT, PROT, ALBUMIN,  in the last 168 hours   Lipase     Component Value Date/Time   LIPASE 17 08/23/2012 0012     Studies/Results: No results found.  Medications: . clindamycin (CLEOCIN) IV  600 mg Intravenous Q6H  . famotidine  20 mg Oral QHS  . heparin  5,000 Units Subcutaneous Q8H  . vancomycin  1,250 mg Intravenous Q12H    Assessment/Plan Cellulitis and abscess of left groin and upper thigh  Staph Aureus cultured, I don't have sensitivity, nor has  it been reported as  MRSA. S/p: Incision and debridement of skin and subcutaneous tissue - left groin, 02/05/2013, Wilmon Arms. Tsuei, MD.  Body mass index is 49.92 kg/(m^2).  Hx of back pain  GERD  Possible sleep disorder.   Plan: He still has a great deal of pain with dressing change, and needs IV meds for this.  I will let him shower and try to take dressing out this PM with dressing change.  And have nurse do repacking of the wound.  We plan to send him home with penrose drains in and follow up as an outpatient for penrose removal.  ABUNDANT STAPHYLOCOCCUS AUREUS on culture, sensitivities are pending.     LOS: 3 days    Annikah Lovins 02/07/2013

## 2013-02-07 NOTE — Progress Notes (Signed)
Todd Velez. Todd Skains, MD, [facility]  General/ Trauma Surgery  02/07/2013 10:06 AM

## 2013-02-08 LAB — BASIC METABOLIC PANEL
CO2: 28 mEq/L (ref 19–32)
Chloride: 101 mEq/L (ref 96–112)
Glucose, Bld: 91 mg/dL (ref 70–99)
Potassium: 4.1 mEq/L (ref 3.5–5.1)
Sodium: 137 mEq/L (ref 135–145)

## 2013-02-08 LAB — CULTURE, ROUTINE-ABSCESS

## 2013-02-08 LAB — GLUCOSE, CAPILLARY: Glucose-Capillary: 90 mg/dL (ref 70–99)

## 2013-02-08 MED ORDER — OXYCODONE HCL 5 MG PO TABS: 5.0000 mg | ORAL_TABLET | ORAL | Status: DC | PRN

## 2013-02-08 MED ORDER — CLINDAMYCIN HCL 300 MG PO CAPS
300.0000 mg | ORAL_CAPSULE | Freq: Four times a day (QID) | ORAL | Status: DC
  Administered 2013-02-08: 300 mg via ORAL
  Filled 2013-02-08 (×2): qty 1

## 2013-02-08 MED ORDER — CLINDAMYCIN HCL 300 MG PO CAPS: 300.0000 mg | ORAL_CAPSULE | Freq: Four times a day (QID) | ORAL | Status: DC

## 2013-02-08 MED ORDER — ACETAMINOPHEN 325 MG PO TABS: 650.0000 mg | ORAL_TABLET | Freq: Four times a day (QID) | ORAL | Status: DC | PRN

## 2013-02-08 MED ORDER — ACETAMINOPHEN 325 MG PO TABS: ORAL_TABLET | ORAL | Status: AC

## 2013-02-08 NOTE — Discharge Summary (Signed)
Discharge home with dressing changes.  Todd Velez. Corliss Skains, MD, [facility]  General/ Trauma Surgery  02/08/2013 11:48 AM

## 2013-02-08 NOTE — Discharge Summary (Signed)
Physician Discharge Summary  Patient ID: Todd Velez MRN: [id number] DOB/AGE: 03/16/1986 27 y.o.  Admit date: 02/04/2013 Discharge date: 02/08/2013  Admission Diagnoses:  Cellulitis and abscess of left groin and upper thigh  Body mass index is 49.92 kg/(m^2).  Hx of back pain  GERD  Possible sleep disorder   Discharge Diagnoses:  Same ABUNDANT METHICILLIN RESISTANT STAPHYLOCOCCUS AUREUS Note: RIFAMPIN AND GENTAMICIN SHOULD NOT BE USED AS SINGLE DRUGS FOR TREATMENT OF STAPH INFECTIONS. This organism DOES NOT demonstrate inducible Clindamycin resistance in vitro.    Principal Problem:   Abscess and cellulitis Active Problems:   Obesity   Asthma   PROCEDURES: S/p: Incision and debridement of skin and subcutaneous tissue - left groin, 02/05/2013, Wilmon Arms. Tsuei, MD.   Hospital Course: Todd Velez is an 27 y.o. male with benign PMH (asthma, obesity, ?sleep apnea), returned to the ER with increased swelling and pain on his left groin. He was seen in the ER, placed on Bactrim and given IV Clinda, advised admission previously, but preferred outpatient tx. Evaluation in the ER included a leukocytosis with WBC of 16.6K, K of 3.3, normal renal fx tests. Attempted aspiration in the ER didn't yield any purulent aspirate. Hospitalist was asked to admit him for cellulitis.  He was seen by Dr. Andrey Campanile night of admission and then Dr. Corliss Skains the following Am.  He was taken to the OR the next day with procedure listed above.  Penrose drains were placed in the deeper tracts.   He has been maintained on Vancomycin and clindamycin IV since admission.  Culture shows Staph aureus, final sensitivities are still pending.  We have been packing wet to dry with NS BID using kerlix.  He is doing well and if he can tolerate dressing change with PO pain meds we plan to send him home today.  Home health is to provide assistance with his dressing changes.  We will continue clindamycin for 10 more days. Follow up  in Office with Dr. Corliss Skains 02/19/13.  He will plan to take drains out then.   Disposition: 01-Home or Self Care  Discharge Orders   Future Appointments Provider Department Dept Phone   02/19/2013 3:10 PM Wilmon Arms. Corliss Skains, MD [facility], [place] [phone]   Future Orders Complete By Expires   Change dressing  As directed    Scheduling Instructions:     You can shower and take current dressing out.  You can get soap and water in the open site.  Then repack with Kerlix.  Wet the Kerlix with sterile saline before you pack it into open wound. You should do dressing changes twice a day.  Call for increased drainage, pain with dressing odor, or fever.       Medication List         acetaminophen 325 MG tablet  Commonly known as:  TYLENOL  Do not take over 4000 mg of tylenol per day.     clindamycin 300 MG capsule  Commonly known as:  CLEOCIN  Take 1 capsule (300 mg total) by mouth every 6 (six) hours.     oxyCODONE 5 MG immediate release tablet  Commonly known as:  Oxy IR/ROXICODONE  Take 1-3 tablets (5-15 mg total) by mouth every 4 (four) hours as needed.     ranitidine 150 MG tablet  Commonly known as:  ZANTAC  Take 150 mg by mouth daily as needed for heartburn.           Follow-up Information   Follow up  with Wynona Luna., MD On 02/19/2013. (Your appointment is at 3:10 PM, be there at 2:40 PM for check in.)    Specialty:  General Surgery   Contact information:   [address] [place] [address] [phone]       Signed: Sherrie George 02/08/2013, 10:16 AM

## 2013-02-08 NOTE — Progress Notes (Signed)
Did well with dressing change - less pain. Discharge home today.  Wilmon Arms. Corliss Skains, MD, [facility]  General/ Trauma Surgery  02/08/2013 11:48 AM

## 2013-02-08 NOTE — Progress Notes (Signed)
3 Days Post-Op  Subjective: Doing better.  I am going to get him some pain meds, let him get up to the shower, and remove dressing.  If he can tolerate dressing without IV pain meds send him home later today.  Objective: Vital signs in last 24 hours: Temp:  [97.6 F (36.4 C)-98.3 F (36.8 C)] 97.6 F (36.4 C) (08/22 0554) Pulse Rate:  [55-71] 55 (08/22 0554) Resp:  [18-20] 18 (08/22 0554) BP: (113-150)/(65-80) 113/66 mmHg (08/22 0554) SpO2:  [98 %-99 %] 99 % (08/22 0554) Last BM Date: 02/07/13 Afebrile, VSS, Carb modified. Creatinine is stable. Intake/Output from previous day: 08/21 0701 - 08/22 0700 In: 2332 [P.O.:590; I.V.:1742] Out: 500 [Urine:500] Intake/Output this shift:    General appearance: alert, cooperative and no distress Skin: He showered and took his dressing down in the shower.  He did fairly well.  site bled some with removal of the kerlix.  Drains intact.  He did will with dressing change today.   Lab Results:   Recent Labs  02/05/13 2012 02/06/13 0702  WBC 11.6* 10.9*  HGB 12.8* 12.3*  HCT 37.2* 36.6*  PLT 236 238    BMET  Recent Labs  02/07/13 1055 02/08/13 0410  NA 138 137  K 3.8 4.1  CL 102 101  CO2 29 28  GLUCOSE 130* 91  BUN 6 5*  CREATININE 0.66 0.65  CALCIUM 8.8 8.7   PT/INR No results found for this basename: LABPROT, INR,  in the last 72 hours  No results found for this basename: AST, ALT, ALKPHOS, BILITOT, PROT, ALBUMIN,  in the last 168 hours   Lipase     Component Value Date/Time   LIPASE 17 08/23/2012 0012     Studies/Results: No results found.  Medications: . clindamycin (CLEOCIN) IV  600 mg Intravenous Q6H  . famotidine  20 mg Oral QHS  . heparin  5,000 Units Subcutaneous Q8H  . vancomycin  1,500 mg Intravenous Q8H    Assessment/Plan Cellulitis and abscess of left groin and upper thigh  Staph Aureus:   ABUNDANT METHICILLIN RESISTANT STAPHYLOCOCCUS AUREUS Note: RIFAMPIN AND GENTAMICIN SHOULD NOT BE USED AS  SINGLE DRUGS FOR TREATMENT OF STAPH INFECTIONS. This organism DOES NOT demonstrate inducible Clindamycin resistance in vitro.   S/p: Incision and debridement of skin and subcutaneous tissue - left groin, 02/05/2013, Wilmon Arms. Tsuei, MD.  Body mass index is 49.92 kg/(m^2).  Hx of back pain  GERD  Possible sleep disorder.   Plan:  Change to PO Clindamycin and hope to send home today.  LOS: 4 days    Todd Velez 02/08/2013

## 2013-02-08 NOTE — Progress Notes (Signed)
Discharge instructions explained to pt and a copy of instructions were given to pt along with prescriptions for cleocin and oxycodone; pt verbalized understanding of instructions.  [facility] to call pt and arrange visit.

## 2013-02-10 LAB — ANAEROBIC CULTURE

## 2013-02-19 ENCOUNTER — Encounter (INDEPENDENT_AMBULATORY_CARE_PROVIDER_SITE_OTHER): Payer: Self-pay | Admitting: Surgery

## 2013-02-25 ENCOUNTER — Encounter (INDEPENDENT_AMBULATORY_CARE_PROVIDER_SITE_OTHER): Payer: Self-pay | Admitting: Surgery

## 2013-03-05 ENCOUNTER — Ambulatory Visit (INDEPENDENT_AMBULATORY_CARE_PROVIDER_SITE_OTHER): Payer: Self-pay | Admitting: Surgery

## 2013-03-05 ENCOUNTER — Encounter (INDEPENDENT_AMBULATORY_CARE_PROVIDER_SITE_OTHER): Payer: Self-pay | Admitting: Surgery

## 2013-03-05 VITALS — BP 136/82 | HR 76 | Resp 16 | Ht 66.0 in | Wt 319.8 lb

## 2013-03-05 DIAGNOSIS — L0291 Cutaneous abscess, unspecified: Secondary | ICD-10-CM

## 2013-03-05 NOTE — Progress Notes (Signed)
Status post incision and drainage of a large left groin abscess On 02/05/13. He was discharged on 02/08/13. His wound is doing quite well. The drains are out. Wound is completely healed except for a very narrow strip of granulation tissue. No surrounding cellulitis. Minimal drainage. He should continue with Neosporin over the granulation tissue until completely healed. Followup when necessary.  Wilmon Arms. Corliss Skains, MD, [facility] Surgery  General/ Trauma Surgery  03/05/2013 4:03 PM

## 2013-05-14 ENCOUNTER — Encounter (HOSPITAL_COMMUNITY): Payer: Self-pay | Admitting: Emergency Medicine

## 2013-05-14 ENCOUNTER — Emergency Department (INDEPENDENT_AMBULATORY_CARE_PROVIDER_SITE_OTHER)
Admission: EM | Admit: 2013-05-14 | Discharge: 2013-05-14 | Disposition: A | Discharge status: Home / Self Care | Payer: Self-pay | Source: Home / Self Care | Attending: Emergency Medicine | Admitting: Emergency Medicine

## 2013-05-14 DIAGNOSIS — A630 Anogenital (venereal) warts: Secondary | ICD-10-CM

## 2013-05-14 MED ORDER — PODOFILOX 0.5 % EX SOLN: Freq: Two times a day (BID) | CUTANEOUS | Status: DC

## 2013-05-14 NOTE — ED Provider Notes (Signed)
Chief Complaint:   Chief Complaint  Patient presents with  . Genital Warts    History of Present Illness:   Todd Velez is a 27 year old male who has an eight-month history of bumps on his foreskin. These are nontender. He has no ulcerations or blisters. He denies any inguinal lymphadenopathy, urethral discharge, dysuria, testicular pain or swelling. He's had no bowel pain, fever, chills, skin rash, or joint pain. The patient is usually fairly good about using condoms, but on one occasion his condom broke and this is when he noticed the penile lesions.  Review of Systems:  Other than noted above, the patient denies any of the following symptoms: Systemic:  No fevers chills, aches, weight loss, arthralgias, myalgias, or adenopathy. GI:  No abdominal pain, nausea or vomiting. GU:  No dysuria, penile pain, discharge, itching, dysuria, genital lesions, testicular pain or swelling. Skin:  No rash or itching.  PMFSH:  Past medical history, family history, social history, meds, and allergies were reviewed. He is allergic to hydrocodone.  Physical Exam:   Vital signs:  BP 132/76  Pulse 70  Temp(Src) 98.8 F (37.1 C) (Oral)  Resp 18  SpO2 99% Gen:  Alert, oriented, in no distress. Abdomen:  Soft and flat, non-distended, and non-tender.  No organomegaly or mass. Genital:  He has numerous, small, filiform, wartlike lesions on the foreskin. Skin:  Warm and dry.  No rash.    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  Treatment options were outlined with him. If the Condylox does not clear this up, suggested followup with a urologist.  Plan:   1.  Meds:  The following meds were prescribed:   Discharge Medication List as of 05/14/2013 10:43 AM    START taking these medications   Details  podofilox (CONDYLOX) 0.5 % external solution Apply topically 2 (two) times daily. Apply to warts BID for 3 days, leave off for 4 days, repeat cycle 4 times., Starting 05/14/2013, Until  Discontinued, Normal        2.  Patient Education/Counseling:  The patient was given appropriate handouts, self care instructions, and instructed in symptomatic relief.The patient was instructed to inform all sexual contacts, avoid intercourse completely for 2 weeks and then only with a condom.  The patient was told that we would call about all abnormal lab results, and that we would need to report certain kinds of infection to the health department.    3.  Follow up:  The patient was told to follow up if no better in 3 to 4 days, if becoming worse in any way, and given some red flag symptoms such as any sign of infection which would prompt immediate return.  Follow up with Dr. Jethro Bolus if no improvement in one month.     Reuben Likes, MD 05/14/13 [phone]

## 2013-05-14 NOTE — ED Notes (Signed)
Patient reports approx 8 month history of penile lesions.  Patient reports having bee evaluated before, but requesting another opinion.

## 2014-05-01 ENCOUNTER — Encounter (HOSPITAL_COMMUNITY): Payer: Self-pay | Admitting: *Deleted

## 2014-05-01 ENCOUNTER — Emergency Department (HOSPITAL_COMMUNITY)
Admission: EM | Admit: 2014-05-01 | Discharge: 2014-05-02 | Disposition: A | Discharge status: Home / Self Care | Payer: Self-pay | Attending: Emergency Medicine | Admitting: Emergency Medicine

## 2014-05-01 DIAGNOSIS — Z79899 Other long term (current) drug therapy: Secondary | ICD-10-CM | POA: Insufficient documentation

## 2014-05-01 DIAGNOSIS — Z87891 Personal history of nicotine dependence: Secondary | ICD-10-CM | POA: Insufficient documentation

## 2014-05-01 DIAGNOSIS — E669 Obesity, unspecified: Secondary | ICD-10-CM | POA: Insufficient documentation

## 2014-05-01 DIAGNOSIS — M62830 Muscle spasm of back: Secondary | ICD-10-CM | POA: Insufficient documentation

## 2014-05-01 DIAGNOSIS — M545 Low back pain, unspecified: Secondary | ICD-10-CM

## 2014-05-01 DIAGNOSIS — G8929 Other chronic pain: Secondary | ICD-10-CM | POA: Insufficient documentation

## 2014-05-01 DIAGNOSIS — J45909 Unspecified asthma, uncomplicated: Secondary | ICD-10-CM | POA: Insufficient documentation

## 2014-05-01 DIAGNOSIS — Z8719 Personal history of other diseases of the digestive system: Secondary | ICD-10-CM | POA: Insufficient documentation

## 2014-05-01 DIAGNOSIS — Z872 Personal history of diseases of the skin and subcutaneous tissue: Secondary | ICD-10-CM | POA: Insufficient documentation

## 2014-05-01 DIAGNOSIS — Z9889 Other specified postprocedural states: Secondary | ICD-10-CM | POA: Insufficient documentation

## 2014-05-01 NOTE — ED Notes (Signed)
Patient presents with c/o mid back pain.  States like his back went out.  History of back surgery 1 1/2 years ago.  Denies numbness or tingling.

## 2014-05-02 MED ORDER — NAPROXEN 500 MG PO TABS: 500.0000 mg | ORAL_TABLET | Freq: Two times a day (BID) | ORAL | Status: DC | PRN

## 2014-05-02 MED ORDER — OXYCODONE-ACETAMINOPHEN 5-325 MG PO TABS
1.0000 | ORAL_TABLET | Freq: Four times a day (QID) | ORAL | Status: DC | PRN
Start: 2014-05-02 — End: 2015-05-25

## 2014-05-02 MED ORDER — CYCLOBENZAPRINE HCL 10 MG PO TABS: 10.0000 mg | ORAL_TABLET | Freq: Three times a day (TID) | ORAL | Status: DC | PRN

## 2014-05-02 NOTE — ED Notes (Signed)
Patient presents stating his back has been hurting to the low mid back for 2 days.  Has a history of surgery to the lower back.  Denies numbness or tingling. Has difficulty moving in the chair

## 2014-05-02 NOTE — ED Provider Notes (Signed)
CSN: [id number]     Arrival date & time 05/01/14  2225 History   First MD Initiated Contact with Patient 05/01/14 2337     Chief Complaint  Patient presents with  . Back Pain     (Consider location/radiation/quality/duration/timing/severity/associated sxs/prior Treatment) HPI Comments: Todd Velez is a 28 y.o. male with a PMHx of chronic lumbar back pain with sciatica s/p laminectomy/decompression microdiscetomy, GERD, asthma, and obesity, who presents to the ED with lower back pain and a similar location as his prior back pain, gradually beginning 2-3 days ago. He describes the pain as 10/10 constant sharp achy pain in the middle of his back, nonradiating, worse with standing and ambulating for long periods of time, and unrelieved by heat and bed rest. He has not tried any medications, not found any alleviating factors. He states that this is similar to his prior back pain. He states he does quite a bit of bending at his job, but does not recall any trauma, injury, heavy lifting, or twisting prior to the onset of his symptoms. He denies any fevers, chills, chest pain, shortness of breath, unexpected weight loss, history of cancer, IV drug use, abdomen pain, nausea, vomiting, diarrhea, constipation, dysuria, hematuria, flank pain, testicular pain or swelling, paresthesias, numbness, tingling, weakness, or cauda equina symptoms. He states in the past he had sciatica symptoms with his back pain, which are not currently ongoing at this time. Ambulatory without assistance, but it's painful  Patient is a 28 y.o. male presenting with back pain. The history is provided by the patient. No language interpreter was used.  Back Pain Location:  Lumbar spine Quality:  Aching (sharp aching) Radiates to:  Does not radiate Pain severity:  Moderate (10/10) Pain is:  Same all the time Onset quality:  Gradual Duration:  3 days Timing:  Constant Progression:  Unchanged Chronicity:  Recurrent Context:  physical stress   Context comment:  Lots of bending at his job Relieved by:  Nothing Worsened by:  Ambulation and standing Ineffective treatments:  Bed rest and heating pad Associated symptoms: no abdominal pain, no bladder incontinence, no bowel incontinence, no chest pain, no dysuria, no fever, no headaches, no leg pain, no numbness, no paresthesias, no perianal numbness, no tingling, no weakness and no weight loss   Risk factors: obesity   Risk factors: no hx of cancer     Past Medical History  Diagnosis Date  . Back pain   . Sleep disorder     pt. reports having sleep study at [age] yrs., old, reports it was normal but he  reports he snores at night    . Obesity   . Complication of anesthesia     "I wake up really really cranky/mad" (02/05/2013)  . Right sciatic nerve pain     "back OR in 2013 relieved it a little bit; still tender q now and then" (02/05/2013)  . Asthma     as a child had "episode" of  asthma, used inhaler "once"  . GERD (gastroesophageal reflux disease)     "only when my girlfriend is pregnant; which she is now" (02/05/2013)  . Cellulitis of left groin     "going into my left leg" (02/05/2013)   Past Surgical History  Procedure Laterality Date  . Lumbar laminectomy/decompression microdiscectomy  01/12/2012    Procedure: LUMBAR LAMINECTOMY/DECOMPRESSION MICRODISCECTOMY;  Surgeon: Emilee HeroMark Leonard Dumonski, MD;  [facility];  Service: Orthopedics;  Laterality: Right;  Right sided lumbar 5-sacrum 1 microdisectomy  . Tonsillectomy  ~  1997  . Groin dissection Left 02/05/2013    Procedure: Debridement of cellulitis/abscess of  thigh/suprapubic region;  Surgeon: Wilmon ArmsMatthew K. Corliss Skainssuei, MD;  Location: [facility] OR;  Service: General;  Laterality: Left;   No family history on file. History  Substance Use Topics  . Smoking status: Former Smoker -- 0.12 packs/day for 3 years    Types: Cigarettes    Quit date: 03/06/2011  . Smokeless tobacco: Never Used     Comment: 02/05/2013 "quit  smoking > 2 yr ago"  . Alcohol Use: 0.0 oz/week     Comment: 02/05/2013 "last beer was in 2013; I substituted marijuana for drinking"    Review of Systems  Constitutional: Negative for fever, chills, weight loss and unexpected weight change.  Respiratory: Negative for shortness of breath.   Cardiovascular: Negative for chest pain.  Gastrointestinal: Negative for nausea, vomiting, abdominal pain, diarrhea, constipation and bowel incontinence.  Genitourinary: Negative for bladder incontinence, dysuria, hematuria, flank pain, penile pain and testicular pain.  Musculoskeletal: Positive for back pain. Negative for myalgias, joint swelling, arthralgias, neck pain and neck stiffness.  Skin: Negative for color change.  Neurological: Negative for dizziness, tingling, tremors, syncope, weakness, light-headedness, numbness, headaches and paresthesias.  Psychiatric/Behavioral: Negative for confusion.   10 Systems reviewed and are negative for acute change except as noted in the HPI.    Allergies  Hydrocodone bitart (antituss) and Hydrocodone-acetaminophen  Home Medications   Prior to Admission medications   Medication Sig Start Date End Date Taking? Authorizing Provider  acetaminophen (TYLENOL) 325 MG tablet Do not take over 4000 mg of tylenol per day. 02/08/13   Sherrie GeorgeWillard Jennings, PA-C  podofilox (CONDYLOX) 0.5 % external solution Apply topically 2 (two) times daily. Apply to warts BID for 3 days, leave off for 4 days, repeat cycle 4 times. 05/14/13   Reuben Likesavid C Keller, MD   BP 145/87 mmHg  Pulse 69  Temp(Src) 98.5 F (36.9 C) (Oral)  Resp 18  Ht 5\' 9"  (1.753 m)  Wt 285 lb (129.275 kg)  BMI 42.07 kg/m2  SpO2 97% Physical Exam  Constitutional: He is oriented to person, place, and time. Vital signs are normal. He appears well-developed and well-nourished.  Non-toxic appearance. No distress.  Afebrile nontoxic NAD, obese male  HENT:  Head: Normocephalic and atraumatic.  Mouth/Throat:  Oropharynx is clear and moist and mucous membranes are normal.  Eyes: Conjunctivae and EOM are normal. Right eye exhibits no discharge. Left eye exhibits no discharge.  Neck: Normal range of motion. Neck supple. No spinous process tenderness and no muscular tenderness present. No rigidity. Normal range of motion present.  Cardiovascular: Normal rate and intact distal pulses.   Pulmonary/Chest: Effort normal. No respiratory distress.  Abdominal: Soft. Normal appearance and bowel sounds are normal. He exhibits no distension. There is no tenderness. There is no rigidity, no rebound, no guarding and no CVA tenderness.  Musculoskeletal:       Lumbar back: He exhibits decreased range of motion (due to pain), tenderness and spasm. He exhibits no bony tenderness, no swelling and no deformity.       Back:  Lumbar spine with mild midline TTP without bony step offs or deformities, mild L sided paraspinous muscle spasm without TTP. ROM limited due to pain but ambulatory without assistance, strength 5/5 in all extremities, sensation grossly intact in all extremities  Neurological: He is alert and oriented to person, place, and time. He has normal strength. No sensory deficit. Gait normal.  Skin: Skin is warm, dry  and intact. No rash noted.  Psychiatric: He has a normal mood and affect.  Nursing note and vitals reviewed.   ED Course  Procedures (including critical care time) Labs Review Labs Reviewed - No data to display  Imaging Review No results found.   EKG Interpretation None      MDM   Final diagnoses:  Midline low back pain without sciatica  Spasm of back muscles    28y/o male with low back pain, acute on chronic, recent exacerbation related to lots of bending at his job. No red flag s/s of low back pain. No s/s of central cord compression or cauda equina. Lower extremities are neurovascularly intact and patient is ambulating without difficulty. Doubt need for emergent imaging at this  time.  Patient was counseled on back pain precautions and told to do activity as tolerated but do not lift, push, or pull heavy objects more than 10 pounds for the next week. Patient counseled to use ice or heat on back for no longer than 15 minutes every hour.   Rx given for muscle relaxer and counseled on proper use of muscle relaxant medication. Rx given for narcotic pain medicine and counseled on proper use of narcotic pain medications. Told that they can increase to every 4 hrs if needed while pain is worse. Counseled not to combine this medication with others containing tylenol. Urged patient not to drink alcohol, drive, or perform any other activities that requires focus while taking either of these medications.   Patient urged to follow-up with PCP if pain does not improve with treatment and rest or if pain becomes recurrent. Urged to return with worsening severe pain, loss of bowel or bladder control, trouble walking. The patient verbalizes understanding and agrees with the plan.   BP 145/87 mmHg  Pulse 69  Temp(Src) 98.5 F (36.9 C) (Oral)  Resp 18  Ht 5\' 9"  (1.753 m)  Wt 285 lb (129.275 kg)  BMI 42.07 kg/m2  SpO2 97%  Meds ordered this encounter  Medications  . oxyCODONE-acetaminophen (PERCOCET) 5-325 MG per tablet    Sig: Take 1-2 tablets by mouth every 6 (six) hours as needed for severe pain.    Dispense:  6 tablet    Refill:  0    Order Specific Question:  Supervising Provider    Answer:  Eber Hong D [[id number]]  . naproxen (NAPROSYN) 500 MG tablet    Sig: Take 1 tablet (500 mg total) by mouth 2 (two) times daily as needed for mild pain, moderate pain or headache (TAKE WITH MEALS.).    Dispense:  20 tablet    Refill:  0    Order Specific Question:  Supervising Provider    Answer:  Eber Hong D [[id number]]  . cyclobenzaprine (FLEXERIL) 10 MG tablet    Sig: Take 1 tablet (10 mg total) by mouth 3 (three) times daily as needed for muscle spasms. Use half a tablet (5mg ) if  10mg  makes you too drowsy.    Dispense:  15 tablet    Refill:  0    Order Specific Question:  Supervising Provider    Answer:  Vida Roller [address] [place], PA-C 05/02/14 0246  Tomasita Crumble, MD 05/02/14 [phone]

## 2014-05-02 NOTE — Discharge Instructions (Signed)
Back Pain:  Your back pain should be treated with medicines such as ibuprofen or aleve and this back pain should get better over the next 2 weeks.  However if you develop severe or worsening pain, low back pain with fever, numbness, weakness or inability to walk or urinate, you should return to the ER immediately.  Please follow up with your doctor this week for a recheck if still having symptoms.  Low back pain is discomfort in the lower back that may be due to injuries to muscles and ligaments around the spine.  Occasionally, it may be caused by a a problem to a part of the spine called a disc.  The pain may last several days or a week;  However, most patients get completely well in 4 weeks.  Self - care:  The application of heat can help soothe the pain.  Maintaining your daily activities, including walking, is encourged, as it will help you get better faster than just staying in bed. Perform gentle stretching as discussed. Drink plenty of fluids.  Medications are also useful to help with pain control.  A commonly prescribed medication includes percocet.  Do not drive or operate heavy machinery while taking this medication.  Non steroidal anti inflammatory medications including Ibuprofen and naproxen;  These medications help both pain and swelling and are very useful in treating back pain.  They should be taken with food, as they can cause stomach upset, and more seriously, stomach bleeding.    Muscle relaxants:  These medications can help with muscle tightness that is a cause of lower back pain.  Most of these medications can cause drowsiness, and it is not safe to drive or use dangerous machinery while taking them.  SEEK IMMEDIATE MEDICAL ATTENTION IF: New numbness, tingling, weakness, or problem with the use of your arms or legs.  Severe back pain not relieved with medications.  Difficulty with or loss of control of your bowel or bladder control.  Increasing pain in any areas of the body  (such as chest or abdominal pain).  Shortness of breath, dizziness or fainting.  Nausea (feeling sick to your stomach), vomiting, fever, or sweats.  You will need to follow up with  Your primary healthcare provider in 1-2 weeks for reassessment.  If you do not have a doctor see the list below, or see the community health and wellness center.  Emergency Department Resource Guide 1) Find a Doctor and Pay Out of Pocket Although you won't have to find out who is covered by your insurance plan, it is a good idea to ask around and get recommendations. You will then need to call the office and see if the doctor you have chosen will accept you as a new patient and what types of options they offer for patients who are self-pay. Some doctors offer discounts or will set up payment plans for their patients who do not have insurance, but you will need to ask so you aren't surprised when you get to your appointment.  2) Contact Your Local Health Department Not all health departments have doctors that can see patients for sick visits, but many do, so it is worth a call to see if yours does. If you don't know where your local health department is, you can check in your phone book. The CDC also has a tool to help you locate your state's health department, and many state websites also have listings of all of their local health departments.  3) Find a  Walk-in Clinic If your illness is not likely to be very severe or complicated, you may want to try a walk in clinic. These are popping up all over the country in pharmacies, drugstores, and shopping centers. They're usually staffed by nurse practitioners or physician assistants that have been trained to treat common illnesses and complaints. They're usually fairly quick and inexpensive. However, if you have serious medical issues or chronic medical problems, these are probably not your best option.  No Primary Care Doctor: - Call [facility] at  [phone] - they can  help you locate a primary care doctor that  accepts your insurance, provides certain services, etc. - Physician Referral Service- [phone]  Chronic Pain Problems: Organization         Address  Phone   Notes  [facility]  [phone] Patients need to be referred by their primary care doctor.   Medication Assistance: Organization         Address  Phone   Notes  [facility] Medication [facility] [address] [place], [place] [address] [phone] --Must be a resident of [facility] -- Must have NO insurance coverage whatsoever (no Medicaid/ Medicare, etc.) -- The pt. MUST have a primary care doctor that directs their care regularly and follows them in the community   [facility]  [phone]   [employer]  [phone]    Agencies that provide inexpensive medical care: Organization         Address  Phone   Notes  [facility]  [phone]   [facility]    [phone]   [facility] [address] [place], [place] [address] [phone]   [facility] [address] [place] [phone]   [facility]    [phone]   [facility]    [phone]   [facility]  [address], [place] Phone:  [phone], Fax:  [phone] Hours of Operation:  9 am - 6 pm, M-F.  Also accepts Medicaid/Medicare and self-pay.  [facility]  [address], [place] Phone: [phone], Fax: [phone]. Hours of Operation:  8:30 am - 5:30 pm, M-F.  Also accepts Medicaid and self-pay.  [facility] [place] [address] [place] Phone: [phone]   [facility] [address] Natasha Bence [place], [place] [phone], Ext. [phone] Mondays & Thursdays: 7-9 AM.  First 15 patients are seen on a first come, first serve basis.    Medicaid-accepting [facility] Providers:  Organization         Address  Phone   Notes  [facility] [address] [place] [address], [place] [phone] Also accepts self-pay patients.  [facility] [address] Laurell Josephs [place], [place]  [phone]   [facility] [address], [place] [phone]   [facility] [address] [place] [phone]   Renaye Rakers [address], [place]   [phone] Only accepts [place] patients after they have their name applied to their card.   Self-Pay (no insurance) in [facility]:  Organization         Address  Phone   Notes  Sickle Cell Patients, [facility] [address] [place],  [place] [phone]   [facility] [address], [place] [phone]   [facility] [place]  [address], [place] [phone]   [facility]/Dr. Osei-Bonsu  [address] [place] [address], [place] or [phone], [place] [phone] Phone number for both [place] and [place] locations is the same.  [facility] [address], [place] [phone]) [phone]   [facility] [place] [address] [place] [address], [place] or [address] [phone]   [facility] [address], [place] [phone], phone; [phone]) [phone], fax Sees patients 1st and 3rd Saturday of every month.  Must not qualify for public or private insurance (i.e. Medicaid, Medicare, [place] Health Choice, Veterans' Benefits)  Household income should be no more than 200% of the poverty level The clinic cannot treat you if you are pregnant or think you are pregnant  Sexually transmitted diseases are not treated at the clinic.   Back Pain, Adult Low back pain is very common. About 1 in 5 people have back pain.The cause of low back pain  is rarely dangerous. The pain often gets better over time.About half of people with a sudden onset of back pain feel better in just 2 weeks. About 8 in 10 people feel better by 6 weeks.  CAUSES Some common causes of back pain include:  Strain of the muscles or ligaments supporting the spine.  Wear and tear (degeneration) of the spinal discs.  Arthritis.  Direct injury to the back. DIAGNOSIS Most of the time, the direct cause of low back pain is not known.However, back pain can be treated effectively even when the exact cause of the pain is unknown.Answering your caregiver's questions about your overall health and symptoms is one of the most accurate ways to make sure the cause of your pain is not dangerous. If your caregiver needs more information, he or she may order lab work or imaging tests (X-rays or MRIs).However, even if imaging tests show changes in your back, this usually does not require surgery. HOME CARE INSTRUCTIONS For many people, back pain returns.Since low back pain is rarely dangerous, it is often a condition that people can learn to [facility] their own.   Remain active. It is stressful on the back to sit or stand in one place. Do not sit, drive, or stand in one place for more than 30 minutes at a time. Take short walks on level surfaces as soon as pain allows.Try to increase the length of time you walk each day.  Do not stay in bed.Resting more than 1 or 2 days can delay your recovery.  Do not avoid exercise or work.Your body is made to move.It is not dangerous to be active, even though your back may hurt.Your back will likely heal faster if you return to being active before your pain is gone.  Pay attention to your body when you bend and lift. Many people have less discomfortwhen lifting if they bend their knees, keep the load close to their bodies,and avoid twisting. Often, the most comfortable positions are those that put less stress on your recovering  back.  Find a comfortable position to sleep. Use a firm mattress and lie on your side with your knees slightly bent. If you lie on your back, put a pillow under your knees.  Only take over-the-counter or prescription medicines as directed by your caregiver. Over-the-counter  medicines to reduce pain and inflammation are often the most helpful.Your caregiver may prescribe muscle relaxant drugs.These medicines help dull your pain so you can more quickly return to your normal activities and healthy exercise.  Put ice on the injured area.  Put ice in a plastic bag.  Place a towel between your skin and the bag.  Leave the ice on for 15-20 minutes, 03-04 times a day for the first 2 to 3 days. After that, ice and heat may be alternated to reduce pain and spasms.  Ask your caregiver about trying back exercises and gentle massage. This may be of some benefit.  Avoid feeling anxious or stressed.Stress increases muscle tension and can worsen back pain.It is important to recognize when you are anxious or stressed and learn ways to manage it.Exercise is a great option. SEEK MEDICAL CARE IF:  You have pain that is not relieved with rest or medicine.  You have pain that does not improve in 1 week.  You have new symptoms.  You are generally not feeling well. SEEK IMMEDIATE MEDICAL CARE IF:   You have pain that radiates from your back into your legs.  You develop new bowel or bladder control problems.  You have unusual weakness or numbness in your arms or legs.  You develop nausea or vomiting.  You develop abdominal pain.  You feel faint. Document Released: 06/06/2005 Document Revised: 12/06/2011 Document Reviewed: 10/08/2013 [facility] Patient Information 2015 [place], [place]. This information is not intended to replace advice given to you by your health care provider. Make sure you discuss any questions you have with your health care provider.  Back Exercises Back exercises help treat  and prevent back injuries. The goal is to increase your strength in your belly (abdominal) and back muscles. These exercises can also help with flexibility. Start these exercises when told by your doctor. HOME CARE Back exercises include: Pelvic Tilt.  Lie on your back with your knees bent. Tilt your pelvis until the lower part of your back is against the floor. Hold this position 5 to 10 sec. Repeat this exercise 5 to 10 times. Knee to Chest.  Pull 1 knee up against your chest and hold for 20 to 30 seconds. Repeat this with the other knee. This may be done with the other leg straight or bent, whichever feels better. Then, pull both knees up against your chest. Sit-Ups or Curl-Ups.  Bend your knees 90 degrees. Start with tilting your pelvis, and do a partial, slow sit-up. Only lift your upper half 30 to 45 degrees off the floor. Take at least 2 to 3 seonds for each sit-up. Do not do sit-ups with your knees out straight. If partial sit-ups are difficult, simply do the above but with only tightening your belly (abdominal) muscles and holding it as told. Hip-Lift.  Lie on your back with your knees flexed 90 degrees. Push down with your feet and shoulders as you raise your hips 2 inches off the floor. Hold for 10 seconds, repeat 5 to 10 times. Back Arches.  Lie on your stomach. Prop yourself up on bent elbows. Slowly press on your hands, causing an arch in your low back. Repeat 3 to 5 times. Shoulder-Lifts.  Lie face down with arms beside your body. Keep hips and belly pressed to floor as you slowly lift your head and shoulders off the floor. Do not overdo your exercises. Be careful in the beginning. Exercises may cause you some mild back discomfort. If the pain  lasts for more than 15 minutes, stop the exercises until you see your doctor. Improvement with exercise for back problems is slow.  Document Released: 07/09/2010 Document Revised: 08/29/2011 Document Reviewed: 04/07/2011 [facility] Patient  Information 2015 [place], [place]. This information is not intended to replace advice given to you by your health care provider. Make sure you discuss any questions you have with your health care provider.  Heat Therapy Heat therapy can help make painful, stiff muscles and joints feel better. Do not use heat on new injuries. Wait at least 48 hours after an injury to use heat. Do not use heat when you have aches or pains right after an activity. If you still have pain 3 hours after stopping the activity, then you may use heat. HOME CARE Wet heat pack  Soak a clean towel in warm water. Squeeze out the extra water.  Put the warm, wet towel in a plastic bag.  Place a thin, dry towel between your skin and the bag.  Put the heat pack on the area for 5 minutes, and check your skin. Your skin may be pink, but it should not be red.  Leave the heat pack on the area for 15 to 30 minutes.  Repeat this every 2 to 4 hours while awake. Do not use heat while you are sleeping. Warm water bath  Fill a tub with warm water.  Place the affected body part in the tub.  Soak the area for 20 to 40 minutes.  Repeat as needed. Hot water bottle  Fill the water bottle half full with hot water.  Press out the extra air. Close the cap tightly.  Place a dry towel between your skin and the bottle.  Put the bottle on the area for 5 minutes, and check your skin. Your skin may be pink, but it should not be red.  Leave the bottle on the area for 15 to 30 minutes.  Repeat this every 2 to 4 hours while awake. Electric heating pad  Place a dry towel between your skin and the heating pad.  Set the heating pad on low heat.  Put the heating pad on the area for 10 minutes, and check your skin. Your skin may be pink, but it should not be red.  Leave the heating pad on the area for 20 to 40 minutes.  Repeat this every 2 to 4 hours while awake.  Do not lie on the heating pad.  Do not fall asleep while using the  heating pad.  Do not use the heating pad near water. GET HELP RIGHT AWAY IF:  You get blisters or red skin.  Your skin is puffy (swollen), or you lose feeling (numbness) in the affected area.  You have any new problems.  Your problems are getting worse.  You have any questions or concerns. If you have any problems, stop using heat therapy until you see your doctor. MAKE SURE YOU:  Understand these instructions.  Will watch your condition.  Will get help right away if you are not doing well or get worse. Document Released: 08/29/2011 Document Reviewed: 07/30/2013 [facility] Patient Information 2015 [place], [place]. This information is not intended to replace advice given to you by your health care provider. Make sure you discuss any questions you have with your health care provider.

## 2014-05-02 NOTE — ED Notes (Signed)
Patient states he does not like to take medicine.

## 2014-06-09 IMAGING — CR DG CHEST 2V
2 series · 2 of 2 positions shown · non-contrast
Comparison: Chest radiograph performed 01/05/2012

CLINICAL DATA: Productive cough for 3 days; chest soreness.

CHEST - 2 VIEW

[w chest pa]
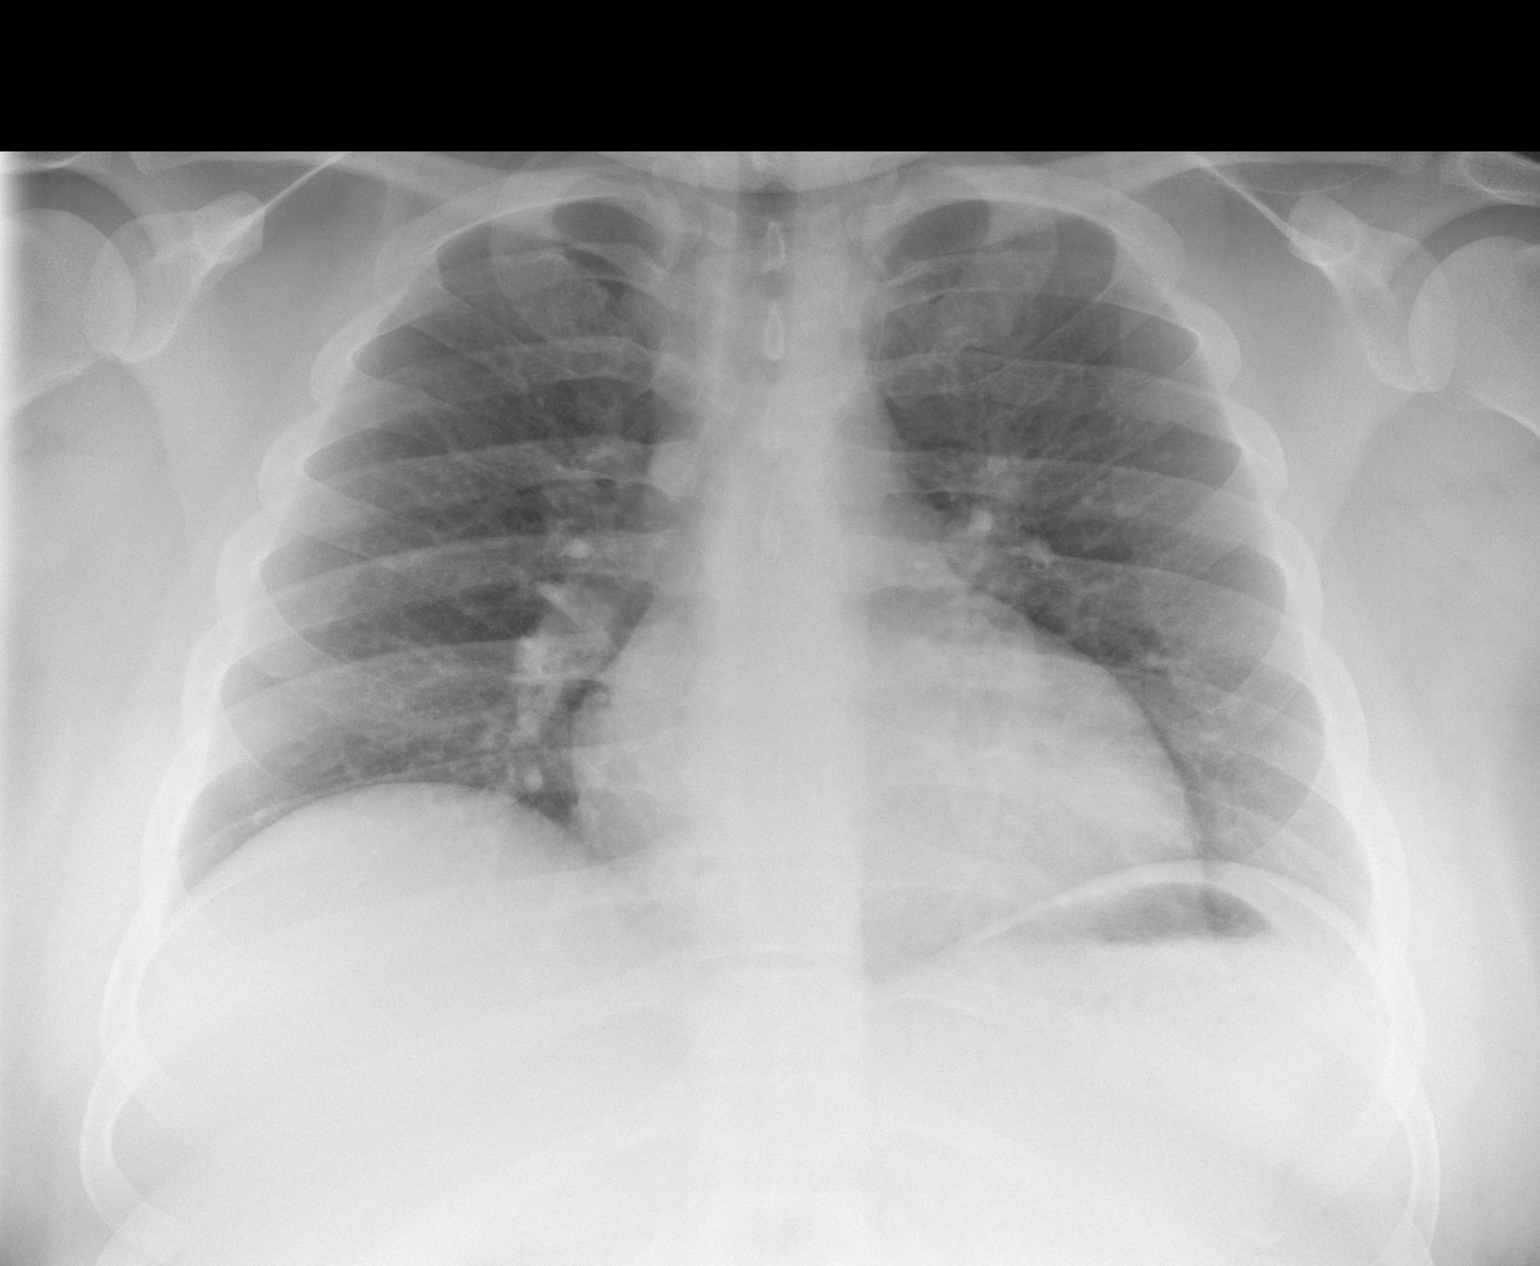

[w chest lat]
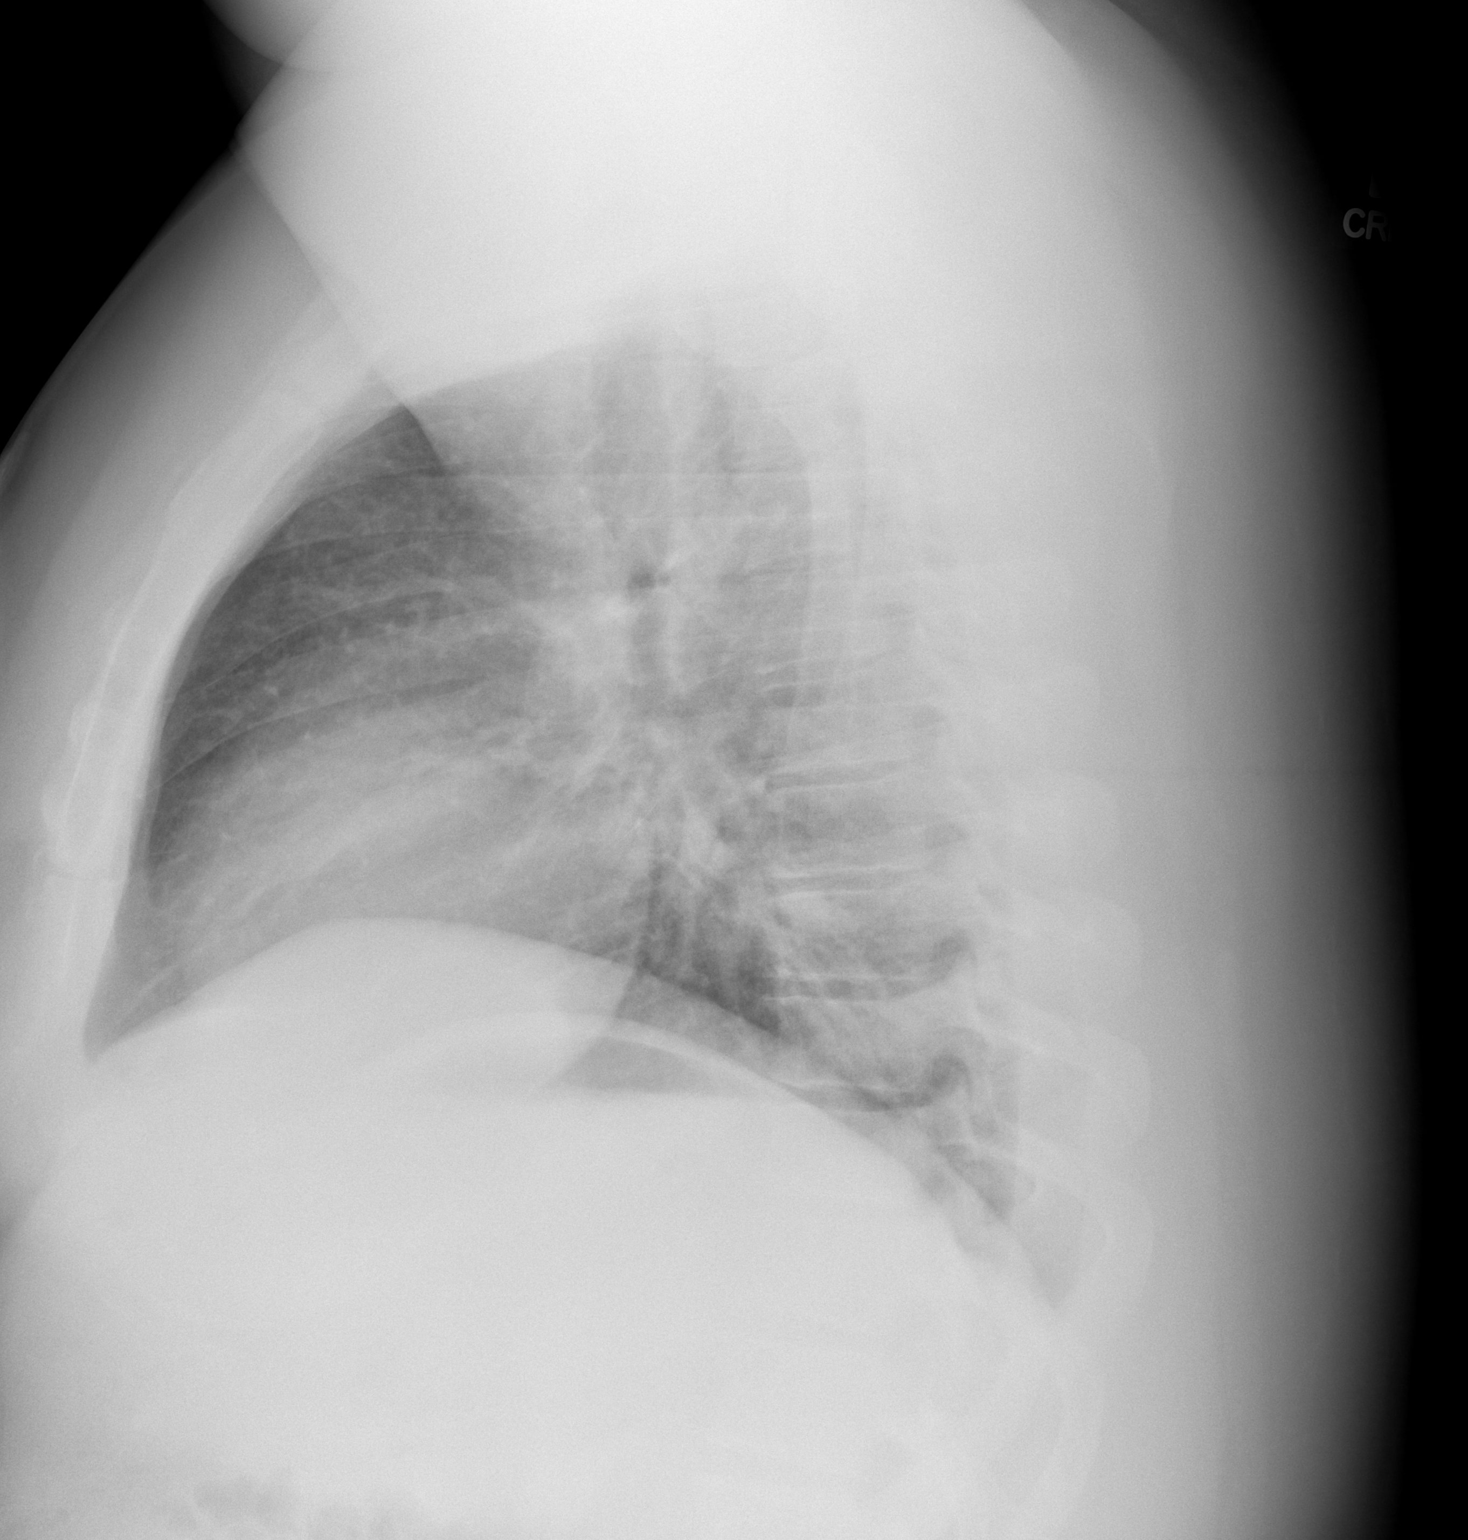

[2 of 2 positions shown; findings below may reference images not displayed]

FINDINGS: The lungs are well-aerated and clear.  There is no
evidence of focal opacification, pleural effusion or pneumothorax.

The heart is borderline normal in size; the mediastinal contour is
within normal limits.  No acute osseous abnormalities are seen.
IMPRESSION: No acute cardiopulmonary process seen.

## 2014-09-17 IMAGING — CR DG CHEST 2V
2 series · 2 of 2 positions shown · non-contrast
Comparison: Chest radiograph performed 06/08/2012

CLINICAL DATA: Shortness of breath and right upper quadrant
abdominal pain.

CHEST - 2 VIEW

[w chest pa]
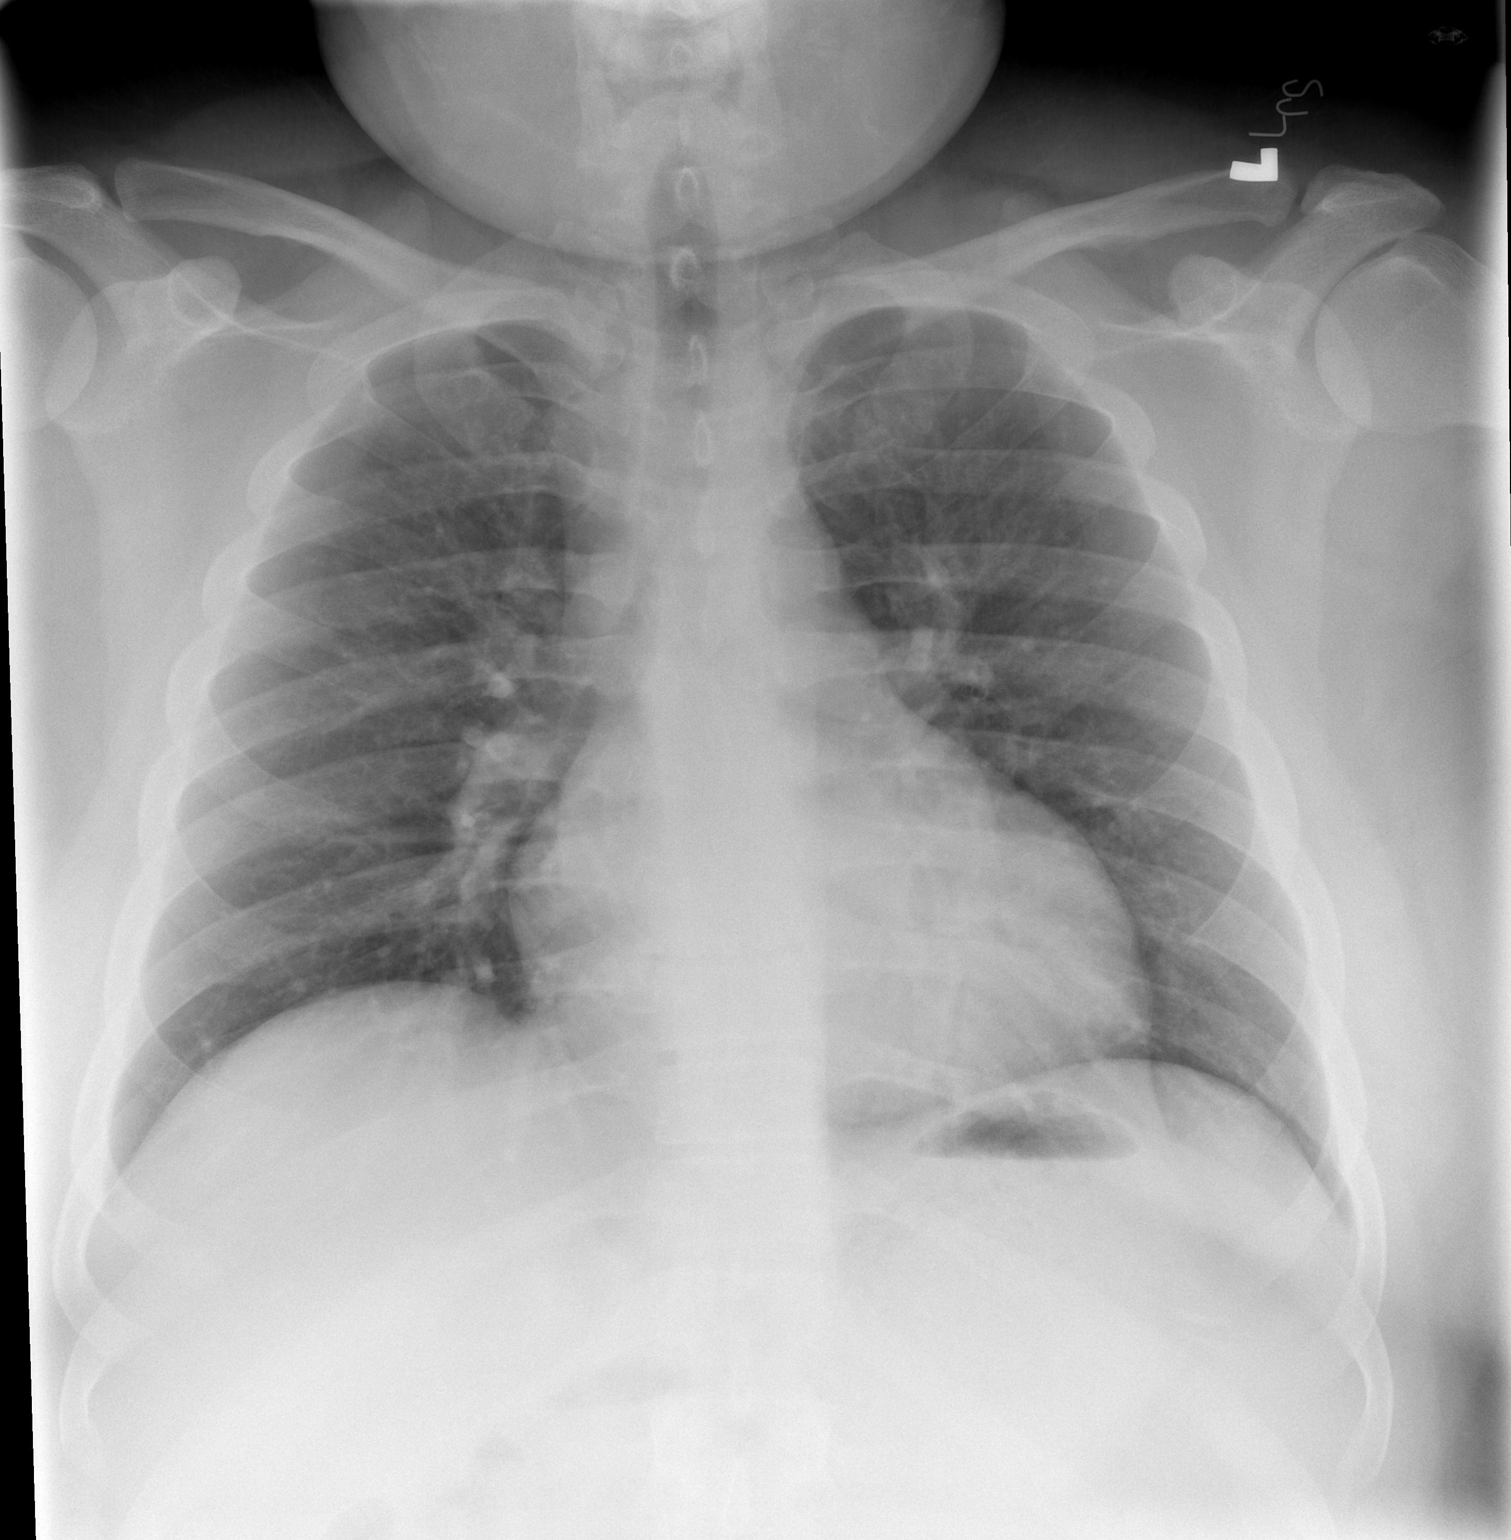

[w chest lat]
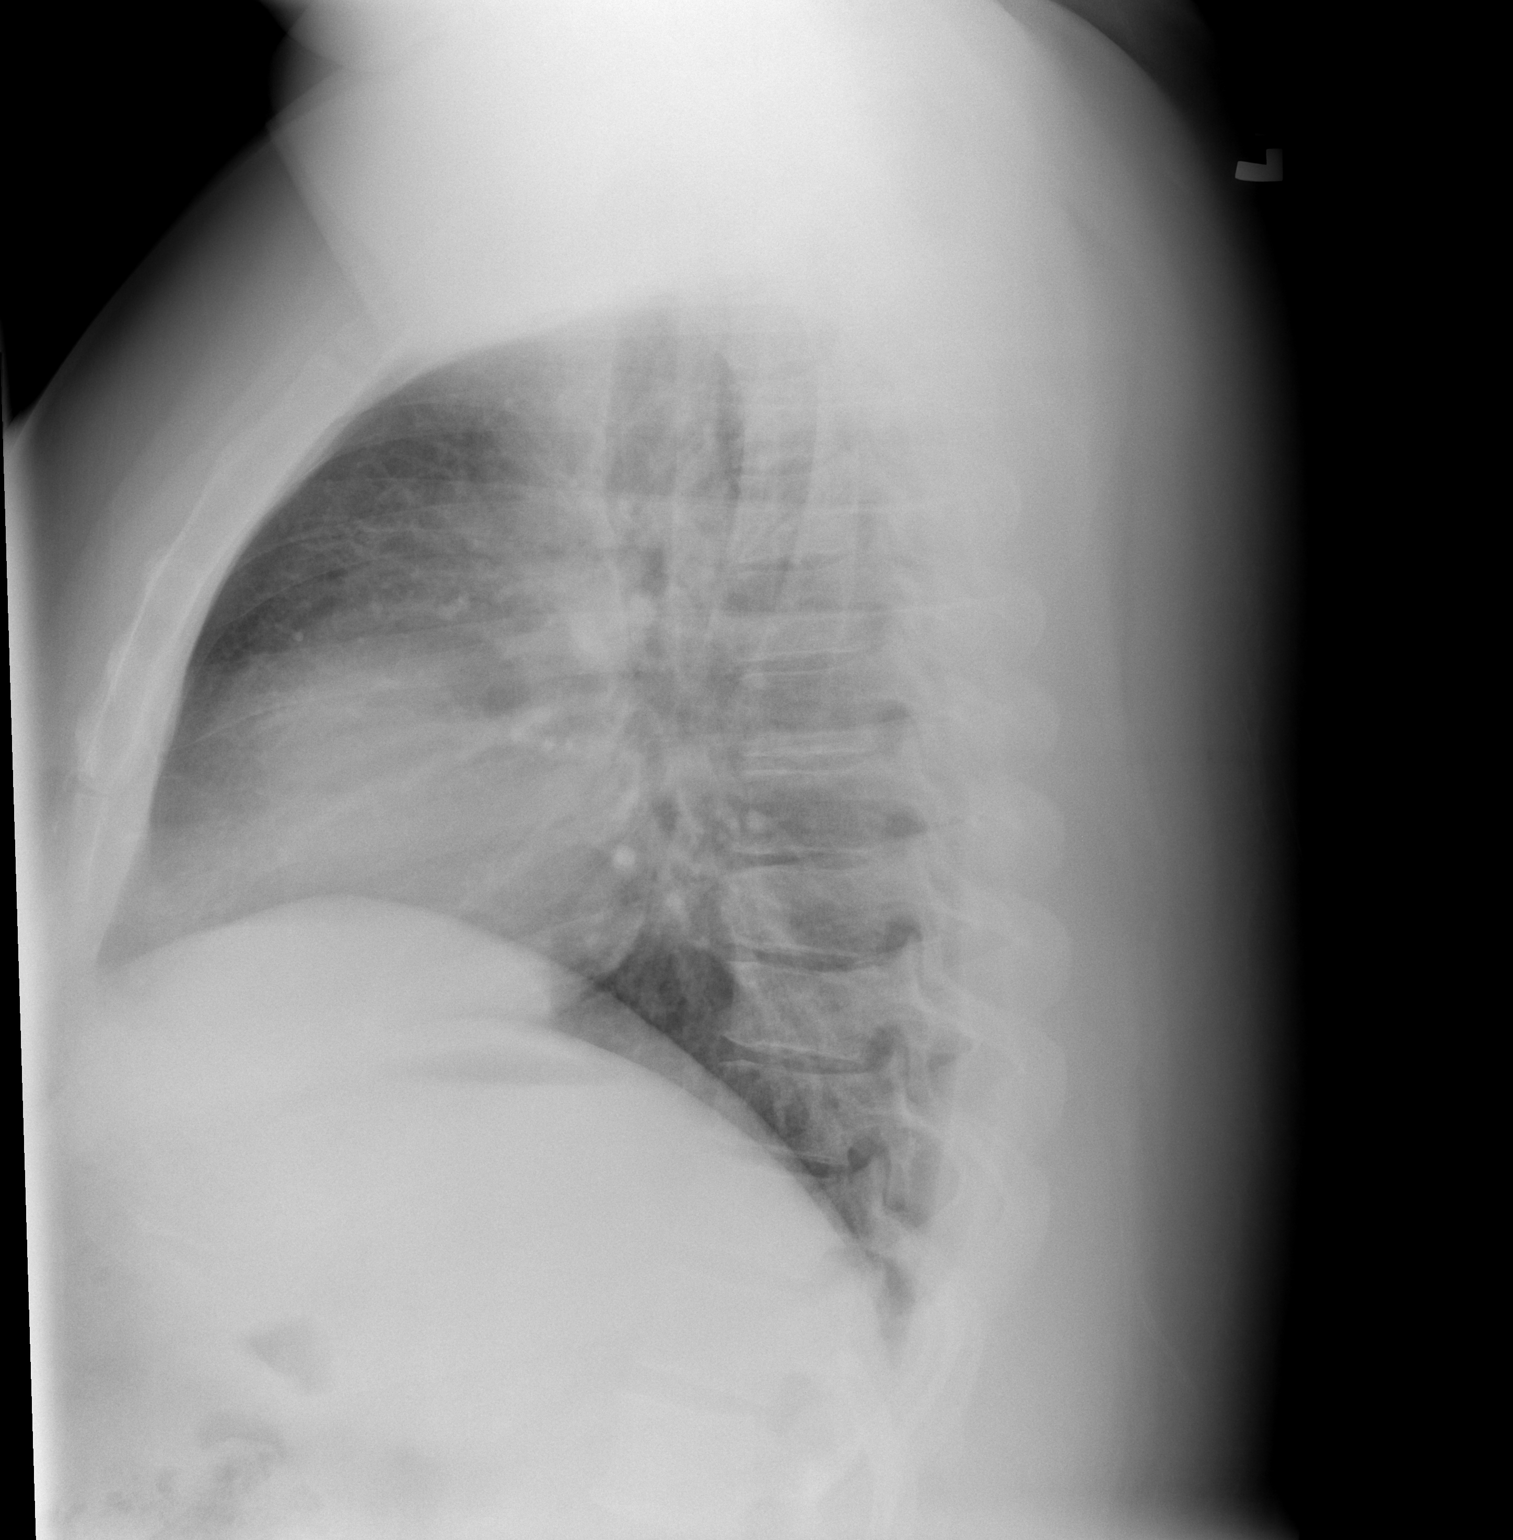

[2 of 2 positions shown; findings below may reference images not displayed]

FINDINGS: The lungs are well-aerated and clear.  There is no
evidence of focal opacification, pleural effusion or pneumothorax.

The heart is borderline normal in size; the mediastinal contour is
within normal limits.  No acute osseous abnormalities are seen.
IMPRESSION: No acute cardiopulmonary process seen.

## 2014-09-25 ENCOUNTER — Emergency Department (HOSPITAL_COMMUNITY)
Admission: EM | Admit: 2014-09-25 | Discharge: 2014-09-25 | Disposition: A | Discharge status: Home / Self Care | Payer: Self-pay | Attending: Emergency Medicine | Admitting: Emergency Medicine

## 2014-09-25 ENCOUNTER — Encounter (HOSPITAL_COMMUNITY): Payer: Self-pay | Admitting: Emergency Medicine

## 2014-09-25 DIAGNOSIS — G8929 Other chronic pain: Secondary | ICD-10-CM | POA: Insufficient documentation

## 2014-09-25 DIAGNOSIS — M5442 Lumbago with sciatica, left side: Secondary | ICD-10-CM | POA: Insufficient documentation

## 2014-09-25 DIAGNOSIS — Z87891 Personal history of nicotine dependence: Secondary | ICD-10-CM | POA: Insufficient documentation

## 2014-09-25 DIAGNOSIS — E669 Obesity, unspecified: Secondary | ICD-10-CM | POA: Insufficient documentation

## 2014-09-25 DIAGNOSIS — Z79899 Other long term (current) drug therapy: Secondary | ICD-10-CM | POA: Insufficient documentation

## 2014-09-25 DIAGNOSIS — J45909 Unspecified asthma, uncomplicated: Secondary | ICD-10-CM | POA: Insufficient documentation

## 2014-09-25 DIAGNOSIS — Z791 Long term (current) use of non-steroidal anti-inflammatories (NSAID): Secondary | ICD-10-CM | POA: Insufficient documentation

## 2014-09-25 DIAGNOSIS — Z8719 Personal history of other diseases of the digestive system: Secondary | ICD-10-CM | POA: Insufficient documentation

## 2014-09-25 DIAGNOSIS — M549 Dorsalgia, unspecified: Secondary | ICD-10-CM

## 2014-09-25 MED ORDER — IBUPROFEN 600 MG PO TABS: 600.0000 mg | ORAL_TABLET | Freq: Four times a day (QID) | ORAL | Status: DC | PRN

## 2014-09-25 MED ORDER — PREDNISONE 20 MG PO TABS: ORAL_TABLET | ORAL | Status: DC

## 2014-09-25 MED ORDER — METHOCARBAMOL 500 MG PO TABS: 500.0000 mg | ORAL_TABLET | Freq: Two times a day (BID) | ORAL | Status: DC

## 2014-09-25 NOTE — ED Provider Notes (Signed)
CSN: [id number]     Arrival date & time 09/25/14  1250 History  This chart was scribed for non-physician practitioner, Junius Finner, PA-C, working with Zadie Rhine, MD by Charline Bills, ED Scribe. This patient was seen in room TR11C/TR11C and the patient's care was started at 1:55 PM.   Chief Complaint  Patient presents with  . Back Pain   The history is provided by the patient. No language interpreter was used.   HPI Comments: Todd Velez is a 29 y.o. male, with a h/o asthma, sciatica, who presents to the Emergency Department complaining of persistent lower L back pain that radiates into L knee for the past 2 weeks. Pt describes pain as a sharp, shooting sensation. He denies heavy lifting, falls, fever, numbness/tingling, urinary or bowel incontinence, nausea, vomiting. He has been treating with Backaid without relief. Pt reports lumbar laminectomy/decompression microdiscectomy by Dr. Yevette Edwards 3 years ago. Allergy to Hydrocodone, reports itching.   Past Medical History  Diagnosis Date  . Back pain   . Sleep disorder     pt. reports having sleep study at 54 yrs., old, reports it was normal but he  reports he snores at night    . Obesity   . Complication of anesthesia     "I wake up really really cranky/mad" (02/05/2013)  . Right sciatic nerve pain     "back OR in 2013 relieved it a little bit; still tender q now and then" (02/05/2013)  . Asthma     as a child had "episode" of  asthma, used inhaler "once"  . GERD (gastroesophageal reflux disease)     "only when my girlfriend is pregnant; which she is now" (02/05/2013)  . Cellulitis of left groin     "going into my left leg" (02/05/2013)   Past Surgical History  Procedure Laterality Date  . Lumbar laminectomy/decompression microdiscectomy  01/12/2012    Procedure: LUMBAR LAMINECTOMY/DECOMPRESSION MICRODISCECTOMY;  Surgeon: Emilee Hero, MD;  Location: [facility] OR;  Service: Orthopedics;  Laterality: Right;  Right sided lumbar  5-sacrum 1 microdisectomy  . Tonsillectomy  ~ 1997  . Groin dissection Left 02/05/2013    Procedure: Debridement of cellulitis/abscess of  thigh/suprapubic region;  Surgeon: Wilmon Arms. Corliss Skains, MD;  Location: [facility] OR;  Service: General;  Laterality: Left;   No family history on file. History  Substance Use Topics  . Smoking status: Former Smoker -- 0.12 packs/day for 3 years    Types: Cigarettes    Quit date: 03/06/2011  . Smokeless tobacco: Never Used     Comment: 02/05/2013 "quit smoking > 2 yr ago"  . Alcohol Use: 0.0 oz/week     Comment: 02/05/2013 "last beer was in 2013; I substituted marijuana for drinking"    Review of Systems  Constitutional: Negative for fever.  Gastrointestinal: Negative for nausea and vomiting.  Musculoskeletal: Positive for back pain.  Neurological: Negative for numbness.   Allergies  Hydrocodone bitart (antituss) and Hydrocodone-acetaminophen  Home Medications   Prior to Admission medications   Medication Sig Start Date End Date Taking? Authorizing Provider  acetaminophen (TYLENOL) 325 MG tablet Do not take over 4000 mg of tylenol per day. 02/08/13   Sherrie George, PA-C  cyclobenzaprine (FLEXERIL) 10 MG tablet Take 1 tablet (10 mg total) by mouth 3 (three) times daily as needed for muscle spasms. Use half a tablet ( ) if  makes you too drowsy. 05/02/14   Mercedes Camprubi-Soms, PA-C  ibuprofen (ADVIL,MOTRIN) 600 MG tablet Take 1 tablet (600 mg total) by  mouth every 6 (six) hours as needed. 09/25/14   Junius FinnerErin O'Malley, PA-C  methocarbamol (ROBAXIN) 500 MG tablet Take 1 tablet (500 mg total) by mouth 2 (two) times daily. 09/25/14   Junius FinnerErin O'Malley, PA-C  naproxen (NAPROSYN) 500 MG tablet Take 1 tablet (500 mg total) by mouth 2 (two) times daily as needed for mild pain, moderate pain or headache (TAKE WITH MEALS.). 05/02/14   Mercedes Camprubi-Soms, PA-C  oxyCODONE-acetaminophen (PERCOCET) 5-325 MG per tablet Take 1-2 tablets by mouth every 6 (six) hours as  needed for severe pain. 05/02/14   Mercedes Camprubi-Soms, PA-C  podofilox (CONDYLOX) 0.5 % external solution Apply topically 2 (two) times daily. Apply to warts BID for 3 days, leave off for 4 days, repeat cycle 4 times. 05/14/13   Reuben Likesavid C Keller, MD  predniSONE (DELTASONE) 20 MG tablet 3 tabs po day one, then 2 po daily x 4 days 09/25/14   Junius FinnerErin O'Malley, PA-C   BP 133/76 mmHg  Pulse 70  Temp(Src) 98.3 F (36.8 C) (Oral)  Resp 22  Ht 5\' 8"  (1.727 m)  Wt 300 lb (136.079 kg)  BMI 45.63 kg/m2  SpO2 97% Physical Exam  Constitutional: He is oriented to person, place, and time. He appears well-developed and well-nourished.  HENT:  Head: Normocephalic and atraumatic.  Eyes: EOM are normal.  Neck: Normal range of motion.  Cardiovascular: Normal rate.   Pulmonary/Chest: Effort normal.  Musculoskeletal: Normal range of motion.  Tenderness of L lumbar muscles and L buttock. No midline spinal tenderness.   Neurological: He is alert and oriented to person, place, and time.  Skin: Skin is warm and dry.  Psychiatric: He has a normal mood and affect. His behavior is normal.  Nursing note and vitals reviewed.  ED Course  Procedures (including critical care time) DIAGNOSTIC STUDIES: Oxygen Saturation is 97% on RA, normal by my interpretation.    COORDINATION OF CARE: 1:57 PM-Discussed treatment plan which includes robaxin, Prednisone and 600 mg ibuprofen with pt at bedside and pt agreed to plan.   Labs Review Labs Reviewed - No data to display  Imaging Review No results found.   EKG Interpretation None      MDM   Final diagnoses:  Left-sided low back pain with left-sided sciatica  Exacerbation of chronic back pain   Pt is a 29yo male presenting to ED with exacerbation of lower back pain with Left sided sciatica. No recent injury or falls. No red flag symptoms.  Do not believe imaging needed at this time. Not concerned for emergent process taking place. Will tx symptomatically as  needed for pain. Rx: prednisone taper, robaxin and ibuprofen. Pt has seen Dr. Barbra Sarksumonskin in the past for lumbar surgery, advised to call to schedule f/u appointment for further evaluation and tx of recurrent lower back pain. Return precautions provided. Pt verbalized understanding and agreement with tx plan.   I personally performed the services described in this documentation, which was scribed in my presence. The recorded information has been reviewed and is accurate.     Junius Finnerrin O'Malley, PA-C 09/25/14 1603  Zadie Rhineonald Wickline, MD 09/25/14 [phone]

## 2014-09-25 NOTE — ED Notes (Signed)
Pt presents with lower back pain that radiates down left leg into left knee- denies new injury.  Pt has had surgery on lower back in the past.

## 2015-05-25 ENCOUNTER — Encounter (HOSPITAL_COMMUNITY): Payer: Self-pay | Admitting: *Deleted

## 2015-05-25 ENCOUNTER — Emergency Department (HOSPITAL_COMMUNITY)
Admission: EM | Admit: 2015-05-25 | Discharge: 2015-05-25 | Disposition: A | Discharge status: Home / Self Care | Payer: Self-pay | Attending: Emergency Medicine | Admitting: Emergency Medicine

## 2015-05-25 DIAGNOSIS — L02413 Cutaneous abscess of right upper limb: Secondary | ICD-10-CM | POA: Insufficient documentation

## 2015-05-25 DIAGNOSIS — Z87891 Personal history of nicotine dependence: Secondary | ICD-10-CM | POA: Insufficient documentation

## 2015-05-25 DIAGNOSIS — L0291 Cutaneous abscess, unspecified: Secondary | ICD-10-CM

## 2015-05-25 DIAGNOSIS — K219 Gastro-esophageal reflux disease without esophagitis: Secondary | ICD-10-CM | POA: Insufficient documentation

## 2015-05-25 DIAGNOSIS — E669 Obesity, unspecified: Secondary | ICD-10-CM | POA: Insufficient documentation

## 2015-05-25 DIAGNOSIS — J45909 Unspecified asthma, uncomplicated: Secondary | ICD-10-CM | POA: Insufficient documentation

## 2015-05-25 MED ORDER — SULFAMETHOXAZOLE-TRIMETHOPRIM 800-160 MG PO TABS: 1.0000 | ORAL_TABLET | Freq: Two times a day (BID) | ORAL | Status: AC

## 2015-05-25 MED ORDER — IBUPROFEN 600 MG PO TABS: 600.0000 mg | ORAL_TABLET | Freq: Four times a day (QID) | ORAL | Status: AC | PRN

## 2015-05-25 MED ORDER — LIDOCAINE HCL (PF) 1 % IJ SOLN
2.0000 mL | Freq: Once | INTRAMUSCULAR | Status: AC
  Administered 2015-05-25: 2 mL
  Filled 2015-05-25: qty 5

## 2015-05-25 MED ORDER — IBUPROFEN 400 MG PO TABS
600.0000 mg | ORAL_TABLET | Freq: Once | ORAL | Status: AC
  Administered 2015-05-25: 600 mg via ORAL
  Filled 2015-05-25: qty 1

## 2015-05-25 MED ORDER — SULFAMETHOXAZOLE-TRIMETHOPRIM 800-160 MG PO TABS
1.0000 | ORAL_TABLET | Freq: Once | ORAL | Status: AC
  Administered 2015-05-25: 1 via ORAL
  Filled 2015-05-25: qty 1

## 2015-05-25 NOTE — ED Notes (Signed)
Pt reports abscess under right arm x 2 days, became very red and swollen overnight. Reports fever, chills.

## 2015-05-25 NOTE — Discharge Instructions (Signed)
Abscess °An abscess is an infected area that contains a collection of pus and debris. It can occur in almost any part of the body. An abscess is also known as a furuncle or boil. °CAUSES  °An abscess occurs when tissue gets infected. This can occur from blockage of oil or sweat glands, infection of hair follicles, or a minor injury to the skin. As the body tries to fight the infection, pus collects in the area and creates pressure under the skin. This pressure causes pain. People with weakened immune systems have difficulty fighting infections and get certain abscesses more often.  °SYMPTOMS °Usually an abscess develops on the skin and becomes a painful mass that is red, warm, and tender. If the abscess forms under the skin, you may feel a moveable soft area under the skin. Some abscesses break open (rupture) on their own, but most will continue to get worse without care. The infection can spread deeper into the body and eventually into the bloodstream, causing you to feel ill.  °DIAGNOSIS  °Your caregiver will take your medical history and perform a physical exam. A sample of fluid may also be taken from the abscess to determine what is causing your infection. °TREATMENT  °Your caregiver may prescribe antibiotic medicines to fight the infection. However, taking antibiotics alone usually does not cure an abscess. Your caregiver may need to make a small cut (incision) in the abscess to drain the pus. In some cases, gauze is packed into the abscess to reduce pain and to continue draining the area. °HOME CARE INSTRUCTIONS  °· Only take over-the-counter or prescription medicines for pain, discomfort, or fever as directed by your caregiver. °· If you were prescribed antibiotics, take them as directed. Finish them even if you start to feel better. °· If gauze is used, follow your caregiver's directions for changing the gauze. °· To avoid spreading the infection: °· Keep your draining abscess covered with a  bandage. °· Wash your hands well. °· Do not share personal care items, towels, or whirlpools with others. °· Avoid skin contact with others. °· Keep your skin and clothes clean around the abscess. °· Keep all follow-up appointments as directed by your caregiver. °SEEK MEDICAL CARE IF:  °· You have increased pain, swelling, redness, fluid drainage, or bleeding. °· You have muscle aches, chills, or a general ill feeling. °· You have a fever. °MAKE SURE YOU:  °· Understand these instructions. °· Will watch your condition. °· Will get help right away if you are not doing well or get worse. °  °This information is not intended to replace advice given to you by your health care provider. Make sure you discuss any questions you have with your health care provider. °  °Document Released: 03/16/2005 Document Revised: 12/06/2011 Document Reviewed: 08/19/2011 °Elsevier Interactive Patient Education ©2016 [employer] ° °Incision and Drainage °Incision and drainage is a procedure in which a sac-like structure (cystic structure) is opened and drained. The area to be drained usually contains material such as pus, fluid, or blood.  °LET YOUR CAREGIVER KNOW ABOUT:  °· Allergies to medicine. °· Medicines taken, including vitamins, herbs, eyedrops, over-the-counter medicines, and creams. °· Use of steroids (by mouth or creams). °· Previous problems with anesthetics or numbing medicines. °· History of bleeding problems or blood clots. °· Previous surgery. °· Other health problems, including diabetes and kidney problems. °· Possibility of pregnancy, if this applies. °RISKS AND COMPLICATIONS °· Pain. °· Bleeding. °· Scarring. °· Infection. °BEFORE THE PROCEDURE  °  You may need to have an ultrasound or other imaging tests to see how large or deep your cystic structure is. Blood tests may also be used to determine if you have an infection or how severe the infection is. You may need to have a tetanus shot. °PROCEDURE  °The affected area  is cleaned with a cleaning fluid. The cyst area will then be numbed with a medicine (local anesthetic). A small incision will be made in the cystic structure. A syringe or catheter may be used to drain the contents of the cystic structure, or the contents may be squeezed out. The area will then be flushed with a cleansing solution. After cleansing the area, it is often gently packed with a gauze or another wound dressing. Once it is packed, it will be covered with gauze and tape or some other type of wound dressing.  °AFTER THE PROCEDURE  °· Often, you will be allowed to go home right after the procedure. °· You may be given antibiotic medicine to prevent or heal an infection. °· If the area was packed with gauze or some other wound dressing, you will likely need to come back in 1 to 2 days to get it removed. °· The area should heal in about 14 days. °  °This information is not intended to replace advice given to you by your health care provider. Make sure you discuss any questions you have with your health care provider. °  °Document Released: 11/30/2000 Document Revised: 12/06/2011 Document Reviewed: 08/01/2011 °Elsevier Interactive Patient Education ©2016 [employer] ° °

## 2015-05-25 NOTE — ED Provider Notes (Signed)
CSN: [id number]     Arrival date & time 05/25/15  1745 History   First MD Initiated Contact with Patient 05/25/15 2122     Chief Complaint  Patient presents with  . Abscess     (Consider location/radiation/quality/duration/timing/severity/associated sxs/prior Treatment) Patient is a 29 y.o. male presenting with abscess.  Abscess Location:  Shoulder/arm Shoulder/arm abscess location:  R arm Size:  3cm Abscess quality: induration, painful and redness   Red streaking: yes   Progression:  Worsening Pain details:    Quality:  Pressure   Severity:  Mild   Duration:  2 days   Timing:  Constant   Progression:  Worsening Chronicity:  New Relieved by:  Nothing Worsened by:  Draining/squeezing Ineffective treatments:  Draining/squeezing Associated symptoms: no fever     Past Medical History  Diagnosis Date  . Back pain   . Sleep disorder     pt. reports having sleep study at 89 yrs., old, reports it was normal but he  reports he snores at night    . Obesity   . Complication of anesthesia     "I wake up really really cranky/mad" (02/05/2013)  . Right sciatic nerve pain     "back OR in 2013 relieved it a little bit; still tender q now and then" (02/05/2013)  . Asthma     as a child had "episode" of  asthma, used inhaler "once"  . GERD (gastroesophageal reflux disease)     "only when my girlfriend is pregnant; which she is now" (02/05/2013)  . Cellulitis of left groin     "going into my left leg" (02/05/2013)   Past Surgical History  Procedure Laterality Date  . Lumbar laminectomy/decompression microdiscectomy  01/12/2012    Procedure: LUMBAR LAMINECTOMY/DECOMPRESSION MICRODISCECTOMY;  Surgeon: Emilee HeroMark Leonard Dumonski, MD;  [facility];  Service: Orthopedics;  Laterality: Right;  Right sided lumbar 5-sacrum 1 microdisectomy  . Tonsillectomy  ~ 1997  . Groin dissection Left 02/05/2013    Procedure: Debridement of cellulitis/abscess of  thigh/suprapubic region;  Surgeon: Wilmon ArmsMatthew K.  Corliss Skainssuei, MD;  Location: [facility] OR;  Service: General;  Laterality: Left;   History reviewed. No pertinent family history. Social History  Substance Use Topics  . Smoking status: Former Smoker -- 0.12 packs/day for 3 years    Types: Cigarettes    Quit date: 03/06/2011  . Smokeless tobacco: Never Used     Comment: 02/05/2013 "quit smoking > 2 yr ago"  . Alcohol Use: 0.0 oz/week     Comment: 02/05/2013 "last beer was in 2013; I substituted marijuana for drinking"    Review of Systems  Constitutional: Negative for fever.  Skin: Positive for wound.  Neurological: Negative for weakness and numbness.      Allergies  Hydrocodone bitart (antituss) and Hydrocodone-acetaminophen  Home Medications   Prior to Admission medications   Medication Sig Start Date End Date Taking? Authorizing Provider  acetaminophen (TYLENOL) 325 MG tablet Do not take over 4000 mg of tylenol per day. 02/08/13   Sherrie GeorgeWillard Jennings, PA-C  ibuprofen (ADVIL,MOTRIN) 600 MG tablet Take 1 tablet (600 mg total) by mouth every 6 (six) hours as needed. 05/25/15   Earley FavorGail Jayke Caul, NP  sulfamethoxazole-trimethoprim (BACTRIM DS,SEPTRA DS) 800-160 MG tablet Take 1 tablet by mouth 2 (two) times daily. 05/25/15   Earley FavorGail Keir Viernes, NP   BP 143/74 mmHg  Pulse 107  Temp(Src) 98.5 F (36.9 C) (Oral)  Resp 16  SpO2 97% Physical Exam  Constitutional: He appears well-developed and well-nourished.  HENT:  Head: Normocephalic.  Eyes: Pupils are equal, round, and reactive to light.  Neck: Normal range of motion.  Cardiovascular: Normal rate and regular rhythm.   Pulmonary/Chest: Effort normal and breath sounds normal.  Musculoskeletal: Normal range of motion. He exhibits tenderness. He exhibits no edema.       Arms: Neurological: He is alert.  Skin: Skin is warm. There is erythema.  Nursing note and vitals reviewed.   ED Course  .Marland KitchenIncision and Drainage Date/Time: 05/25/2015 10:04 PM Performed by: Earley Favor Authorized by: Earley Favor Consent: Verbal consent obtained. Written consent not obtained. Risks and benefits: risks, benefits and alternatives were discussed Patient understanding: patient states understanding of the procedure being performed Patient identity confirmed: verbally with patient Time out: Immediately prior to procedure a "time out" was called to verify the correct patient, procedure, equipment, support staff and site/side marked as required. Type: abscess Body area: upper extremity Location details: right arm Anesthesia: local infiltration Local anesthetic: lidocaine 1% without epinephrine Anesthetic total: 2 ml Patient sedated: no Scalpel size: 11 Needle gauge: 22 Incision type: single straight Incision depth: subcutaneous Complexity: simple Drainage: purulent Drainage amount: moderate Wound treatment: wound left open Patient tolerance: Patient tolerated the procedure well with no immediate complications   (including critical care time) Labs Review Labs Reviewed - No data to display  Imaging Review No results found. I have personally reviewed and evaluated these images and lab results as part of my medical decision-making.   EKG Interpretation None     Started on septra due to minimal streaking  MDM   Final diagnoses:  Abscess         Earley Favor, NP 05/25/15 2206  Loren Racer, MD 05/25/15 2224
